# Patient Record
Sex: Female | Born: 1985 | Race: White | Hispanic: No | Smoking: Never smoker
Health system: Southern US, Community
[De-identification: ages and names within clinical notes are randomized; demographics above are authoritative.]

## PROBLEM LIST (undated history)

## (undated) ENCOUNTER — Inpatient Hospital Stay (HOSPITAL_COMMUNITY): Payer: Self-pay

## (undated) DIAGNOSIS — O034 Incomplete spontaneous abortion without complication: Secondary | ICD-10-CM

## (undated) DIAGNOSIS — Z9851 Tubal ligation status: Secondary | ICD-10-CM

## (undated) DIAGNOSIS — O09299 Supervision of pregnancy with other poor reproductive or obstetric history, unspecified trimester: Secondary | ICD-10-CM

## (undated) DIAGNOSIS — N390 Urinary tract infection, site not specified: Secondary | ICD-10-CM

## (undated) DIAGNOSIS — F329 Major depressive disorder, single episode, unspecified: Secondary | ICD-10-CM

## (undated) HISTORY — DX: Supervision of pregnancy with other poor reproductive or obstetric history, unspecified trimester: O09.299

## (undated) HISTORY — DX: Urinary tract infection, site not specified: N39.0

## (undated) HISTORY — PX: NO PAST SURGERIES: SHX2092

---

## 2009-09-07 DIAGNOSIS — F32A Depression, unspecified: Secondary | ICD-10-CM

## 2009-09-07 HISTORY — DX: Depression, unspecified: F32.A

## 2010-07-03 ENCOUNTER — Inpatient Hospital Stay (HOSPITAL_COMMUNITY): Admission: AD | Admit: 2010-07-03 | Discharge: 2010-07-03 | Payer: Self-pay | Admitting: Obstetrics and Gynecology

## 2010-07-28 ENCOUNTER — Inpatient Hospital Stay (HOSPITAL_COMMUNITY)
Admission: AD | Admit: 2010-07-28 | Discharge: 2010-07-28 | Payer: Self-pay | Source: Home / Self Care | Admitting: Obstetrics and Gynecology

## 2010-08-14 ENCOUNTER — Inpatient Hospital Stay (HOSPITAL_COMMUNITY)
Admission: AD | Admit: 2010-08-14 | Discharge: 2010-08-14 | Payer: Self-pay | Source: Home / Self Care | Admitting: Obstetrics and Gynecology

## 2010-09-26 ENCOUNTER — Inpatient Hospital Stay (HOSPITAL_COMMUNITY)
Admission: AD | Admit: 2010-09-26 | Discharge: 2010-09-28 | Payer: Self-pay | Source: Home / Self Care | Attending: Obstetrics and Gynecology | Admitting: Obstetrics and Gynecology

## 2010-09-26 ENCOUNTER — Encounter (INDEPENDENT_AMBULATORY_CARE_PROVIDER_SITE_OTHER): Payer: Self-pay | Admitting: Obstetrics and Gynecology

## 2010-09-29 LAB — RPR: RPR Ser Ql: NONREACTIVE

## 2010-09-29 LAB — CBC
Hemoglobin: 13.3 g/dL (ref 12.0–15.0)
MCV: 88.7 fL (ref 78.0–100.0)
Platelets: 187 10*3/uL (ref 150–400)
RBC: 4.32 MIL/uL (ref 3.87–5.11)
WBC: 11.2 10*3/uL — ABNORMAL HIGH (ref 4.0–10.5)

## 2010-09-29 LAB — D-DIMER, QUANTITATIVE: D-Dimer, Quant: 0.95 ug/mL-FEU — ABNORMAL HIGH (ref 0.00–0.48)

## 2010-09-30 LAB — CBC
HCT: 31.7 % — ABNORMAL LOW (ref 36.0–46.0)
Platelets: 166 10*3/uL (ref 150–400)
RBC: 3.6 MIL/uL — ABNORMAL LOW (ref 3.87–5.11)
RDW: 12.8 % (ref 11.5–15.5)
WBC: 10.2 10*3/uL (ref 4.0–10.5)

## 2010-10-01 LAB — WOUND CULTURE: Culture: NO GROWTH

## 2010-10-01 LAB — TORCH-IGM(TOXO/ RUB/ CMV/ HSV) W TITER
CMV IgM: 0.27 Index (ref <0.90)
HSV IgM Ab SCREEN: NOT DETECTED
RPR Screen: NONREACTIVE
Rubella IgM Index: 2.75 Ratio — ABNORMAL HIGH (ref <0.90)
Toxoplasma IgM: NEGATIVE

## 2010-10-03 LAB — ANAEROBIC CULTURE

## 2010-10-08 DEATH — deceased

## 2010-10-10 NOTE — Discharge Summary (Signed)
NAME:  [name], [name]              ACCOUNT NO.:  [id number]  MEDICAL RECORD NO.:  [id number]          PATIENT TYPE:  INP  LOCATION:  9302                          [facility]ACILITY:  [facility]  PHYSICIAN:  Hal Morales, M.D.DATE OF BIRTH:  02-05-1986  DATE OF ADMISSION:  09/26/2010 DATE OF DISCHARGE:  09/28/2010                              DISCHARGE SUMMARY   ADMISSION DIAGNOSES: 1. Intrauterine fetal demise at 40 weeks and 1 day. 2. Unfavorable cervix, plan induction with Cytotec.  DISCHARGE DIAGNOSES: 1. Intrauterine fetal demise at 40 weeks and 1 day. 2. Unfavorable cervix, plan induction with Cytotec. 3. Apparent cord accident.  HOSPITAL COURSE:  The patient was admitted to labor and delivery at 11:45 a.m. after being diagnosed with an intrauterine fetal demise in a routine office visit.  The patient had not complained of decreased fetal movement, leaking, bleeding, fever or chills at that visit.  On admission, her cervix was 1-2 cm, 50% -2 vertex, posterior. Contractions were irregular and mild. Plan was made to start Cytotec for induction.  The patient was seen by the chaplain that evening.  The patient received 50 mcg, 100 mcg, and 200 mcg doses of Cytotec throughout the evening and night of January 20.  She was started on Dilaudid PCA for pain management.  She experienced itching and was given Benadryl and the Dilaudid PCA was stopped.  Her membranes were artificially ruptured at 2:30 a.m. on January 21.  She had slightly pink tinged fluid with no odor.  Bedside ultrasound was performed secondary to the patient feeling that the baby had moved under her chest more.  Baby was vertex but slightly oblique with head to mom's left.  Cervix was 2-3 but still long, so the 200 mcg dose of Cytotec was placed at that time.  The patient was switched to morphine PCA.  She declined epidural.    Low dose Pitocin was started at approximately 10:30 a.m. on the 21st.  By 5:30 in the evening,  she had progressed 5-6 cm, 80%, -2, and wished to proceed with an epidural.  The patient had a normal spontaneous vaginal delivery of a 9 pound 10 ounce stillborn female infant at 8:03 p.m. APGARS 0 and 0.  Cord was noted to be tightly wrapped around his shoulders and chest and wrapped around his upper left arm.  There was minimal skin sloughing or maceration and minimal overriding of cranial sutures.  There was adherent blood noted on 1/8th to 1/4th of the placental margin representing a questionable abruption.  The uterus was slightly boggy after delivery of placenta.  A 1000 mcg of Cytotec was placed rectally with good response.  The patient's family was present and supportive. Placenta was sent to pathology.  Comfort care and process was initiated. The patient declined autopsy.  The patient had a routine postpartum course.  Vital signs remained stable.  She was afebrile throughout her stay.  Lochia was appropriate.  Social work consultation was performed on September 28, 2010.  The patient stated she had an excellent support system including her husband and extended family.  The patient's mother remained at the bedside.  Her  husband was in and out taking care of their 17 and 82-year-old daughters.  The patient was tearful but appropriate throughout her stay.  She was given information about [facility], hospice and palliative care at [facility] and [facility].  In addition to the hospital photographs, she also had pictures taken by a photographer with [employer].  She desired discharge on postpartum day #1.  Labs were as follows.  Admission CBC showed white blood cell count 11.2, hemoglobin 13.3, hematocrit 38.3, and platelet count of 187,000.  D- dimer elevated at 0.95 but appropriate for gestation.  Fibrinogen normal at 465.  RPR nonreactive.  Postpartum CBC showed a white blood cell count of 10.2, hemoglobin 10.9, hematocrit 31.7, platelet count of 166,000.  Preliminary  anaerobic cultures showed no growth.  TORCH titers negative for all except positive Rubella IgM, although in routine prenatal labs she was shown to be rubella equivocal.  Preliminary aerobic wound culture of the placenta shows no growth.  Placenta pathology report shows a normal 620 g placenta with 3-vessel cord.  No evidence of chorioamnionitis.  There was narrowing of the cord approximately 9 cm from the fetal where the cord measured approximately 1 cm in diameter and there is hemorrhagic discoloration in this area which may represent possible stricture.    DISCHARGE CONDITION: The patient was discharged home in stable condition on postpartum day #1.  DISCHARGE INSTRUCTIONS: Per the Taking Care of Yourself booklet which is appropriate for mother's experiencing IUFD. Postpartum depression  precautions were given and the patient was strongly encouraged to call if symptoms developed and was also offered referral for couseling as needed.  DISCHARGE MEDICATIONS:  Ibuprofen 600 mg p.o. q.6 p.r.n. for pain, Ambien 10 mg p.o. at bedtime p.r.n. for insomnia.    DISCHARGE FOLLOW-UP: [facility] in 6 weeks or p.r.n.     [name], CNM   ______________________________ Hal Morales, M.D.    VS/MEDQ  D:  10/02/2010  T:  10/02/2010  Job:  [id number]  Electronically Signed by [name]  on 10/05/2010 04:54:09 PM Electronically Signed by Dierdre Forth M.D. on 10/10/2010 11:38:28 AM

## 2010-11-18 LAB — URINE MICROSCOPIC-ADD ON

## 2010-11-18 LAB — DIFFERENTIAL
Basophils Absolute: 0 10*3/uL (ref 0.0–0.1)
Lymphocytes Relative: 12 % (ref 12–46)
Monocytes Absolute: 0.6 10*3/uL (ref 0.1–1.0)
Neutro Abs: 8.4 10*3/uL — ABNORMAL HIGH (ref 1.7–7.7)

## 2010-11-18 LAB — COMPREHENSIVE METABOLIC PANEL
Albumin: 3 g/dL — ABNORMAL LOW (ref 3.5–5.2)
BUN: 5 mg/dL — ABNORMAL LOW (ref 6–23)
Chloride: 107 mEq/L (ref 96–112)
Creatinine, Ser: 0.42 mg/dL (ref 0.4–1.2)
Total Bilirubin: 0.9 mg/dL (ref 0.3–1.2)

## 2010-11-18 LAB — CBC
MCH: 32.7 pg (ref 26.0–34.0)
MCHC: 35.5 g/dL (ref 30.0–36.0)
MCV: 92.1 fL (ref 78.0–100.0)
Platelets: 186 10*3/uL (ref 150–400)
RBC: 4.18 MIL/uL (ref 3.87–5.11)
RDW: 13.1 % (ref 11.5–15.5)

## 2010-11-18 LAB — URINALYSIS, ROUTINE W REFLEX MICROSCOPIC
Leukocytes, UA: NEGATIVE
Nitrite: NEGATIVE
Specific Gravity, Urine: 1.025 (ref 1.005–1.030)
pH: 7 (ref 5.0–8.0)

## 2010-11-18 LAB — URINE CULTURE

## 2010-11-19 LAB — URINALYSIS, DIPSTICK ONLY
Bilirubin Urine: NEGATIVE
Glucose, UA: NEGATIVE mg/dL
Hgb urine dipstick: NEGATIVE
Ketones, ur: 15 mg/dL — AB
Leukocytes, UA: NEGATIVE
Nitrite: NEGATIVE
Protein, ur: NEGATIVE mg/dL
Specific Gravity, Urine: <1.005 — ABNORMAL LOW (ref 1.005–1.030)
Urobilinogen, UA: 0.2 mg/dL (ref 0.0–1.0)
pH: 7 (ref 5.0–8.0)

## 2011-03-23 ENCOUNTER — Encounter (HOSPITAL_COMMUNITY): Payer: Self-pay | Admitting: *Deleted

## 2011-03-23 NOTE — H&P (Addendum)
NAMETaffie, [name]              ACCOUNT NO.:  [id number]  MEDICAL RECORD NO.:  [id number]  LOCATION:  PERIO                         FACILITY:  [facility]  PHYSICIAN:  Janine Limbo, M.D.DATE OF BIRTH:  [birth year]  DATE OF ADMISSION:  03/24/2011 DATE OF DISCHARGE:                             HISTORY & PHYSICAL   HISTORY OF PRESENT ILLNESS:  Ms. [name] is a 25 year old female, gravida 4, para 3-0-0-2, who presents with a first trimester miscarriage.  She is to have a dilatation and evacuation performed.  The patient has been followed at the [facility].  An ultrasound confirmed no fetal heart tones.  The gestation measures 10 weeks' in size by ultrasound even though the patient should be closer to 13 weeks. No fetal heart motions were noted.  OBSTETRICAL HISTORY:  The patient has had 2 term vaginal deliveries and 1 term vaginal delivery of a nonviable infant.  DRUG ALLERGIES:  No known drug allergies.  PAST MEDICAL HISTORY:  The patient has a history of pyelonephritis.  She also has had her wisdom teeth removed.  SOCIAL HISTORY:  The patient denies cigarette use and recreational drug use other than occasional alcohol.  REVIEW OF SYSTEMS:  Noncontributory.  FAMILY HISTORY:  Noncontributory.  PHYSICAL EXAMINATION:  VITAL SIGNS:  Weight is 155 pounds.  Height is 5 feet 8 inches. HEENT:  Within normal limits. CHEST:  Clear. HEART:  Regular rate and rhythm. BREASTS:  Without masses. ABDOMEN:  Nontender. EXTREMITIES:  Grossly normal. NEUROLOGIC:  Grossly normal. PELVIC:  The cervix was closed and long when last checked.  LABORATORY VALUES:  The patient's blood type is O+, antibody screen negative, VDRL nonreactive, rubella immune, HBsAg negative, HIV nonreactive.  ASSESSMENT:  First trimester miscarriage.  PLAN:  The patient will undergo dilatation and evacuation.  She understands the indications  for her surgical procedure and she accepts the risks of, but not limited to, anesthetic complications, bleeding, infection, and possible damage to surrounding organs.     Janine Limbo, M.D.     AVS/MEDQ  D:  03/23/2011  T:  03/23/2011  Job:  [id number]

## 2011-03-24 ENCOUNTER — Ambulatory Visit (HOSPITAL_COMMUNITY)
Admission: RE | Admit: 2011-03-24 | Discharge: 2011-03-24 | Disposition: A | Discharge status: Home / Self Care | Payer: Managed Care, Other (non HMO) | Source: Ambulatory Visit | Attending: Obstetrics and Gynecology | Admitting: Obstetrics and Gynecology

## 2011-03-24 ENCOUNTER — Ambulatory Visit (HOSPITAL_COMMUNITY): Payer: Managed Care, Other (non HMO) | Admitting: Anesthesiology

## 2011-03-24 ENCOUNTER — Encounter (HOSPITAL_COMMUNITY): Payer: Self-pay | Admitting: Obstetrics and Gynecology

## 2011-03-24 ENCOUNTER — Encounter (HOSPITAL_COMMUNITY): Payer: Self-pay | Admitting: Anesthesiology

## 2011-03-24 ENCOUNTER — Other Ambulatory Visit: Payer: Self-pay | Admitting: Obstetrics and Gynecology

## 2011-03-24 ENCOUNTER — Encounter (HOSPITAL_COMMUNITY)
Admission: RE | Disposition: A | Discharge status: Home / Self Care | Payer: Self-pay | Source: Ambulatory Visit | Attending: Obstetrics and Gynecology

## 2011-03-24 DIAGNOSIS — O034 Incomplete spontaneous abortion without complication: Secondary | ICD-10-CM

## 2011-03-24 HISTORY — DX: Incomplete spontaneous abortion without complication: O03.4

## 2011-03-24 HISTORY — DX: Major depressive disorder, single episode, unspecified: F32.9

## 2011-03-24 HISTORY — PX: DILATION AND EVACUATION: SHX1459

## 2011-03-24 LAB — CBC
HCT: 38.6 % (ref 36.0–46.0)
MCV: 86.5 fL (ref 78.0–100.0)
Platelets: 234 10*3/uL (ref 150–400)
RBC: 4.46 MIL/uL (ref 3.87–5.11)
WBC: 5.9 10*3/uL (ref 4.0–10.5)

## 2011-03-24 SURGERY — DILATION AND EVACUATION, UTERUS
Anesthesia: Monitor Anesthesia Care

## 2011-03-24 MED ORDER — LIDOCAINE HCL (CARDIAC) 20 MG/ML IV SOLN
INTRAVENOUS | Status: AC
  Filled 2011-03-24: qty 5

## 2011-03-24 MED ORDER — IBUPROFEN 800 MG PO TABS: 800.0000 mg | ORAL_TABLET | Freq: Three times a day (TID) | ORAL | Status: DC | PRN

## 2011-03-24 MED ORDER — PROPOFOL 10 MG/ML IV EMUL
INTRAVENOUS | Status: AC
  Filled 2011-03-24: qty 20

## 2011-03-24 MED ORDER — FENTANYL CITRATE 0.05 MG/ML IJ SOLN: 25.0000 ug | INTRAMUSCULAR | Status: DC | PRN

## 2011-03-24 MED ORDER — HYDROCODONE-ACETAMINOPHEN 10-500 MG PO TABS: 1.0000 | ORAL_TABLET | Freq: Four times a day (QID) | ORAL | Status: AC | PRN

## 2011-03-24 MED ORDER — FENTANYL CITRATE 0.05 MG/ML IJ SOLN
INTRAMUSCULAR | Status: DC | PRN
  Administered 2011-03-24: 50 ug via INTRAVENOUS
  Administered 2011-03-24 (×2): 25 ug via INTRAVENOUS

## 2011-03-24 MED ORDER — BUPIVACAINE-EPINEPHRINE 0.5% -1:200000 IJ SOLN
INTRAMUSCULAR | Status: DC | PRN
  Administered 2011-03-24: 10 mL

## 2011-03-24 MED ORDER — ONDANSETRON HCL 4 MG/2ML IJ SOLN
INTRAMUSCULAR | Status: DC | PRN
  Administered 2011-03-24: 4 mg via INTRAVENOUS

## 2011-03-24 MED ORDER — DIPHENHYDRAMINE HCL 25 MG PO CAPS: 50.0000 mg | ORAL_CAPSULE | ORAL | Status: DC | PRN

## 2011-03-24 MED ORDER — MIDAZOLAM HCL 5 MG/5ML IJ SOLN
INTRAMUSCULAR | Status: DC | PRN
  Administered 2011-03-24: 2 mg via INTRAVENOUS

## 2011-03-24 MED ORDER — ONDANSETRON HCL 4 MG/2ML IJ SOLN
INTRAMUSCULAR | Status: AC
  Filled 2011-03-24: qty 2

## 2011-03-24 MED ORDER — MEPERIDINE HCL 25 MG/ML IJ SOLN: 6.2500 mg | INTRAMUSCULAR | Status: DC | PRN

## 2011-03-24 MED ORDER — HYDROCODONE-ACETAMINOPHEN 5-325 MG PO TABS
1.0000 | ORAL_TABLET | Freq: Once | ORAL | Status: AC
  Administered 2011-03-24: 1 via ORAL

## 2011-03-24 MED ORDER — PROMETHAZINE HCL 12.5 MG PO TABS: 12.5000 mg | ORAL_TABLET | Freq: Four times a day (QID) | ORAL | Status: DC | PRN

## 2011-03-24 MED ORDER — LACTATED RINGERS IV SOLN
INTRAVENOUS | Status: DC
  Administered 2011-03-24 (×3): via INTRAVENOUS

## 2011-03-24 MED ORDER — LIDOCAINE HCL (CARDIAC) 20 MG/ML IV SOLN
INTRAVENOUS | Status: DC | PRN
  Administered 2011-03-24: 50 mg via INTRAVENOUS

## 2011-03-24 MED ORDER — HYDROCODONE-ACETAMINOPHEN 5-325 MG PO TABS
ORAL_TABLET | ORAL | Status: AC
  Administered 2011-03-24: 1 via ORAL
  Filled 2011-03-24: qty 1

## 2011-03-24 MED ORDER — KETOROLAC TROMETHAMINE 30 MG/ML IJ SOLN
INTRAMUSCULAR | Status: DC | PRN
  Administered 2011-03-24: 30 mg via INTRAVENOUS

## 2011-03-24 MED ORDER — FENTANYL CITRATE 0.05 MG/ML IJ SOLN
INTRAMUSCULAR | Status: AC
  Administered 2011-03-24: 50 ug
  Filled 2011-03-24: qty 2

## 2011-03-24 MED ORDER — KETOROLAC TROMETHAMINE 60 MG/2ML IM SOLN
INTRAMUSCULAR | Status: DC | PRN
  Administered 2011-03-24 (×2): 30 mg via INTRAMUSCULAR

## 2011-03-24 MED ORDER — FENTANYL CITRATE 0.05 MG/ML IJ SOLN
INTRAMUSCULAR | Status: AC
  Filled 2011-03-24: qty 2

## 2011-03-24 MED ORDER — MIDAZOLAM HCL 2 MG/2ML IJ SOLN
INTRAMUSCULAR | Status: AC
  Filled 2011-03-24: qty 2

## 2011-03-24 MED ORDER — PROPOFOL 10 MG/ML IV EMUL
INTRAVENOUS | Status: DC | PRN
  Administered 2011-03-24 (×2): 20 mg via INTRAVENOUS
  Administered 2011-03-24: 60 mg via INTRAVENOUS
  Administered 2011-03-24: 50 mg via INTRAVENOUS

## 2011-03-24 SURGICAL SUPPLY — 21 items
CATH ROBINSON RED A/P 16FR (CATHETERS) ×2 IMPLANT
CLOTH BEACON ORANGE TIMEOUT ST (SAFETY) ×2 IMPLANT
DECANTER SPIKE VIAL GLASS SM (MISCELLANEOUS) ×2 IMPLANT
DRAPE UTILITY XL STRL (DRAPES) ×2 IMPLANT
GLOVE BIOGEL PI IND STRL 8.5 (GLOVE) ×1 IMPLANT
GLOVE BIOGEL PI INDICATOR 8.5 (GLOVE) ×1
GLOVE ECLIPSE 8.0 STRL XLNG CF (GLOVE) ×4 IMPLANT
GOWN BRE IMP SLV AUR LG STRL (GOWN DISPOSABLE) ×2 IMPLANT
GOWN STRL REIN 2XL LVL4 (GOWN DISPOSABLE) ×2 IMPLANT
KIT BERKELEY 1ST TRIMESTER 3/8 (MISCELLANEOUS) ×2 IMPLANT
NEEDLE SPNL 22GX3.5 QUINCKE BK (NEEDLE) ×2 IMPLANT
NS IRRIG 1000ML POUR BTL (IV SOLUTION) ×2 IMPLANT
PACK VAGINAL MINOR WOMEN LF (CUSTOM PROCEDURE TRAY) ×2 IMPLANT
PAD PREP 24X48 CUFFED NSTRL (MISCELLANEOUS) ×2 IMPLANT
SET BERKELEY SUCTION TUBING (SUCTIONS) ×2 IMPLANT
SYR CONTROL 10ML LL (SYRINGE) ×2 IMPLANT
TOWEL OR 17X24 6PK STRL BLUE (TOWEL DISPOSABLE) ×4 IMPLANT
VACURETTE 10 RIGID CVD (CANNULA) ×2 IMPLANT
VACURETTE 7MM CVD STRL WRAP (CANNULA) IMPLANT
VACURETTE 8 RIGID CVD (CANNULA) ×2 IMPLANT
VACURETTE 9 RIGID CVD (CANNULA) IMPLANT

## 2011-03-24 NOTE — Brief Op Note (Signed)
03/24/2011  12:51 PM  PATIENT:  [name]  25 y.o. female  PRE-OPERATIVE DIAGNOSIS:  missed ab  POST-OPERATIVE DIAGNOSIS:  * No post-op diagnosis entered *  PROCEDURE:  Procedure(s): DILATATION AND EVACUATION (D&E)  SURGEON:  Surgeon(s): Janine Limbo  PHYSICIAN ASSISTANT:   ASSISTANTS: none   ANESTHESIA:   IV sedation  ESTIMATED BLOOD LOSS: * No blood loss amount entered *   BLOOD ADMINISTERED:none  DRAINS: none   LOCAL MEDICATIONS USED:  MARCAINE 10CC  SPECIMEN:  Source of Specimen:  POC  DISPOSITION OF SPECIMEN:  PATHOLOGY  COUNTS:  NO None  TOURNIQUET:  * No tourniquets in log *  DICTATION #: [id number]  PLAN OF CARE: Discharge  PATIENT DISPOSITION:  PACU - hemodynamically stable.   Delay start of Pharmacological VTE agent (>24hrs) due to surgical blood loss or risk of bleeding:  yes       Op Note dictated: [id number]

## 2011-03-24 NOTE — Transfer of Care (Signed)
Immediate Anesthesia Transfer of Care Note  Patient: [name]  Procedure(s) Performed:  DILATATION AND EVACUATION (D&E)  Patient Location: PACU  Anesthesia Type: MAC  Level of Consciousness: awake, alert  and oriented  Airway & Oxygen Therapy: Patient Spontanous Breathing  Post-op Assessment: Report given to PACU RN and Post -op Vital signs reviewed and stable  Post vital signs: Reviewed and stable  Complications: No apparent anesthesia complications

## 2011-03-24 NOTE — Anesthesia Postprocedure Evaluation (Signed)
  Anesthesia Post-op Note  Patient: [name]  Procedure(s) Performed:  DILATATION AND EVACUATION (D&E)  Patient Location: PACU  Anesthesia Type: MAC  Level of Consciousness: awake, alert  and oriented  Airway and Oxygen Therapy: Patient Spontanous Breathing  Post-op Pain: none  Post-op Assessment: Post-op Vital signs reviewed, Patient's Cardiovascular Status Stable, Respiratory Function Stable, Patent Airway, No signs of Nausea or vomiting and Pain level controlled  Post-op Vital Signs: Reviewed and stable  Complications: No apparent anesthesia complications

## 2011-03-24 NOTE — Anesthesia Preprocedure Evaluation (Signed)
Anesthesia Evaluation  Name, MR# and DOB Patient awake  General Assessment Comment  Reviewed: Allergy & Precautions, H&P  and Patient's Chart, lab work & pertinent test results  Airway Mallampati: III TM Distance: >3 FB Neck ROM: Full    Dental No notable dental hx (+) Teeth Intact   Pulmonaryneg pulmonary ROS  Asthma: Pregnancy Induced. Inhalers ineffective.    pulmonary exam normal   Cardiovascular Regular Normal   Neuro/Psych  GI/Hepatic/Renal negative GI ROS, negative Liver ROS, and negative Renal ROS (+)       Endo/Other  Negative Endocrine ROS (+)   Abdominal Normal abdominal exam  (+)   Musculoskeletal negative musculoskeletal ROS (+)  Hematology   Peds  Reproductive/Obstetrics negative OB ROS (+) Pregnancy 13 wks   Anesthesia Other Findings             Anesthesia Physical Anesthesia Plan  ASA: II  Anesthesia Plan: MAC and General   Post-op Pain Management:    Induction: Intravenous  Airway Management Planned: Mask and Oral ETT  Additional Equipment:   Intra-op Plan:   Post-operative Plan: Extubation in OR  Informed Consent: I have reviewed the patients History and Physical, chart, labs and discussed the procedure including the risks, benefits and alternatives for the proposed anesthesia with the patient or authorized representative who has indicated his/her understanding and acceptance.   Dental advisory given  Plan Discussed with: Anesthesiologist (AP)  Anesthesia Plan Comments:         Anesthesia Quick Evaluation

## 2011-03-25 NOTE — Op Note (Signed)
NAMETaneah, Masri Jonny              ACCOUNT NO.:  [id number]  MEDICAL RECORD NO.:  [id number]  LOCATION:  [facility]                          [facility]  PHYSICIAN:  Janine Limbo, M.D.DATE OF BIRTH:  1985/11/08  DATE OF PROCEDURE:  03/24/2011 DATE OF DISCHARGE:  03/24/2011                              OPERATIVE REPORT   PREOPERATIVE DIAGNOSIS:  First trimester incomplete abortion.  POSTOPERATIVE DIAGNOSIS:  First trimester incomplete abortion.  PROCEDURE:  Suction dilatation and evacuation.  SURGEON:  Janine Limbo, MD  FIRST ASSISTANT:  None.  ANESTHETIC:  Monitored anesthetic control.  DISPOSITION:  Ms. Pilling is a 25 year old female, gravida 4, para 3-0- 0-2, who presents with a first trimester incomplete abortion.  She understands the indications for her surgical procedure as well as the alternative treatment options.  She accepts the risks of, but not limited to, anesthetic complications, bleeding, infection, and possible damage to the surrounding organs.  FINDINGS:  The patient is blood type is O+.  A large-to-moderate amount of products of conception were removed from a 12-week size uterus.  No adnexal masses were appreciated.  PROCEDURE:  The patient was taken to the operating room where she was given medication through her IV line.  The perineum and vagina were prepped with multiple layers of Betadine.  The bladder was emptied.  The patient was sterilely draped.  An examination under anesthesia was performed.  A paracervical block was placed using 10 mL of 0.25% Marcaine with epinephrine.  The uterus sounded to 12 cm.  The cervix was gently dilated.  The uterine cavity was then evacuated using a size 10 suction curette followed by medium sharp curette.  The cavity was felt to be clean at the end of our procedure.  Hemostasis was adequate.  All instruments were removed.  The patient's exam was repeated and the uterus was noted to be small and firm.   Sponge and needle counts were correct.  The estimated blood loss was approximately 100 mL.  The patient tolerated her procedure well.  She was awakened from her anesthetic without difficulty and transported to the recovery room in stable condition.  The products of conception was sent to pathology.  FOLLOWUP INSTRUCTIONS:  The patient will return to see Dr. Stefano Gaul in 2 weeks for followup examination.  She was given a copy of the postoperative instructions for the patients who have undergone a dilatation and evacuation.  She was given a prescription for 1. Motrin 800 mg one tablet every 8 hours as needed for mild-to-     moderate pain. 2. Vicodin one or two tablets every 4 hours as needed for severe pain. 3. Phenergan 12.5 mg one tablet every 4-6 hours as needed for nausea.     Janine Limbo, M.D.     AVS/MEDQ  D:  03/24/2011  T:  April 01, 2011  Job:  [id number]

## 2011-03-26 ENCOUNTER — Ambulatory Visit (HOSPITAL_COMMUNITY): Payer: Managed Care, Other (non HMO)

## 2011-03-26 ENCOUNTER — Ambulatory Visit (HOSPITAL_COMMUNITY)
Admission: RE | Admit: 2011-03-26 | Discharge: 2011-03-26 | Disposition: A | Discharge status: Home / Self Care | Payer: Managed Care, Other (non HMO) | Source: Ambulatory Visit | Attending: Obstetrics and Gynecology | Admitting: Obstetrics and Gynecology

## 2011-03-26 ENCOUNTER — Other Ambulatory Visit: Payer: Self-pay | Admitting: Maternal and Fetal Medicine

## 2011-03-26 DIAGNOSIS — N96 Recurrent pregnancy loss: Secondary | ICD-10-CM | POA: Insufficient documentation

## 2011-03-26 DIAGNOSIS — IMO0002 Reserved for concepts with insufficient information to code with codable children: Secondary | ICD-10-CM | POA: Insufficient documentation

## 2011-03-26 NOTE — Progress Notes (Signed)
Genetic Counseling  High-Risk Gestation Note  Appointment Date:  03/26/2011 Referred By: [name] Nails, MD Date of Birth:  1986/05/14  I met with Ms. [name] for preconception genetic counseling given a history of two pregnancy losses.   Both family histories were reviewed and found to be contributory  for multiple miscarriages.  The patient reported that she and her partner have two healthy girls. Their third pregnancy, a son, resulted in intrauterine fetal demise at [redacted] weeks gestation. The cord was reportedly narrowed and wrapped around the baby's neck. However, the patient reported that the official cause was reported as undetermined. The patient recently had a first trimester miscarriage. She reported that her mother has a history of 5 miscarriages and one daughter who was stillborn at 21 weeks. Her The patient's parents also had 12 children in addition to the pregnancy losses. An underlying cause for her mother's pregnancy losses is not known.   Approximately 1 in 6 confirmed pregnancies results in miscarriage. A single underlying cause is more likely to be suspected when a couple has experienced 3 or more losses. It is less likely that there will be an identifiable single underlying cause when a couple has experienced less than 3 losses. We discussed several possible causes including chromosome rearrangements, antibodies, and thrombophilia. We reviewed chromosomes and examples of chromosome conditions. In approximately 3-8% of couples with recurrent pregnancy loss, one partner carries a chromosome variant, such as a balanced translocation. Being a carrier of a chromosome variant can increase the risk for abnormalities in the sperm or egg cell, which can increase the risk for miscarriage or the birth of a child with birth defects and/or mental retardation. We reviewed that inherited predisposition to clotting can also increase the risk for miscarriage given the association with increased  risk for disrupted blood flow in the pregnancy. Additionally, the presence of certain antibodies have been associated with an increased risk for miscarriage. Regarding testing for these factors given the patient's history, Dr. Sherrie Collins recommended assessing for lupus anticoagulant, anticardiolipin, and beta-2 glycoprotein. We also discussed the option of blood chromosome analysis for the patient and her partner. After careful consideration, the patient elected to proceed with these studies, which were performed at the time of today's visit. We discussed that blood chromosome analysis is also available to the patient's partner. The couple may consider this pending the results of [name]' testing.  Additionally, the patient reported that her sister, the youngest of the siblings, has achondroplasia. We discussed that achondroplasia is a common genetic form of disproportionate short stature.  Approximately 80% of cases of achondroplasia occur as a result of a de novo mutation. Given that no additional relatives were reported with dwarfism, we discussed that her sister's achondroplasia was likely the result of a de novo mutation, which would not increase recurrence risk for [name]' offspring. Additional information regarding her sister's condition may alter recurrence risk assessment. Without further information regarding the provided family history, an accurate genetic risk cannot be calculated.   Further genetic counseling is warranted if more information is obtained.  She denied exposure to environmental toxins or chemical agents.  She denied the use of alcohol, tobacco or street drugs.  Her medical and surgical history were contributory for D&C following her recent miscarriage.   We counseled the patient for approximately 30 minutes regarding the above risks.     Lori Braun Trusten Hume, MS, [facility] 03/26/2011

## 2011-04-08 ENCOUNTER — Other Ambulatory Visit: Payer: Self-pay | Admitting: Maternal and Fetal Medicine

## 2011-04-08 DEATH — deceased

## 2011-04-09 ENCOUNTER — Encounter (HOSPITAL_COMMUNITY): Payer: Self-pay | Admitting: Obstetrics and Gynecology

## 2011-04-10 ENCOUNTER — Telehealth (HOSPITAL_COMMUNITY): Payer: Self-pay | Admitting: MS"

## 2011-04-10 NOTE — Telephone Encounter (Signed)
  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 Other studies from her visit are pending. Reviewed that peripheral chromosome analysis also available for her husband. She will talk with him and contact our office should he desire to pursue this option.

## 2011-05-14 ENCOUNTER — Telehealth (HOSPITAL_COMMUNITY): Payer: Self-pay | Admitting: MS"

## 2011-05-14 NOTE — Telephone Encounter (Signed)
Patient called inquiring about lab results. Discussed that lupus anticoagulant panel, beta-2-glycoprotein antibodies, and cardiolipin antibodies were normal. Reviewed that previously discussed normal blood chromosome analysis for patient (46,XX). Reviewed that chromosome analysis is also available for patient's partner. However, we are less likely to identify an etiology for pregnancy loss when a couple has experienced less than 2 losses. The patient stated that she would discuss with her partner and call us to schedule appointment for blood draw, should he desire to proceed with chromosome analysis.

## 2011-09-08 NOTE — L&D Delivery Note (Signed)
Delivery Note At 6:39 PM a viable and healthy female was delivered via Vaginal, Spontaneous Delivery (Presentation: OA ).  APGAR: 9, 9; weight- pending .   Placenta status: Intact, Spontaneous.  Cord: 3 vessels with the following complications: None.  Cord pH:not sent. Cord blood sent and also collected for banking/donation.  Anesthesia: Epidural  Episiotomy: None Lacerations: None Est. Blood Loss (mL): 400 cc  Mom to postpartum.  Baby to with mother.  Edmundo Tedesco R 06/16/2012, 7:33 PM

## 2011-09-21 ENCOUNTER — Telehealth: Payer: Self-pay | Admitting: *Deleted

## 2011-09-21 NOTE — Telephone Encounter (Signed)
patient confirmed over the phone the new date and time on 10-26-2011 starting at 1:00pm

## 2011-09-22 ENCOUNTER — Telehealth: Payer: Self-pay | Admitting: Oncology

## 2011-09-22 NOTE — Telephone Encounter (Signed)
Referred by Dr. Shea [facility] AT [facility]

## 2011-10-26 ENCOUNTER — Encounter: Payer: Self-pay | Admitting: Oncology

## 2011-10-26 ENCOUNTER — Ambulatory Visit: Payer: Managed Care, Other (non HMO)

## 2011-10-26 ENCOUNTER — Telehealth: Payer: Self-pay | Admitting: Oncology

## 2011-10-26 ENCOUNTER — Ambulatory Visit (HOSPITAL_BASED_OUTPATIENT_CLINIC_OR_DEPARTMENT_OTHER): Payer: Managed Care, Other (non HMO) | Admitting: Oncology

## 2011-10-26 ENCOUNTER — Other Ambulatory Visit (HOSPITAL_BASED_OUTPATIENT_CLINIC_OR_DEPARTMENT_OTHER): Payer: Managed Care, Other (non HMO) | Admitting: Lab

## 2011-10-26 ENCOUNTER — Other Ambulatory Visit: Payer: Self-pay | Admitting: *Deleted

## 2011-10-26 VITALS — BP 103/67 | HR 84 | Temp 98.6°F | Ht 69.0 in | Wt 153.2 lb

## 2011-10-26 DIAGNOSIS — N96 Recurrent pregnancy loss: Secondary | ICD-10-CM

## 2011-10-26 DIAGNOSIS — D6859 Other primary thrombophilia: Secondary | ICD-10-CM

## 2011-10-26 DIAGNOSIS — D689 Coagulation defect, unspecified: Secondary | ICD-10-CM

## 2011-10-26 LAB — CBC WITH DIFFERENTIAL/PLATELET
BASO%: 0.3 % (ref 0.0–2.0)
EOS%: 1 % (ref 0.0–7.0)
HCT: 41 % (ref 34.8–46.6)
LYMPH%: 22.2 % (ref 14.0–49.7)
MCH: 29.6 pg (ref 25.1–34.0)
MCHC: 34.3 g/dL (ref 31.5–36.0)
NEUT%: 72 % (ref 38.4–76.8)
Platelets: 221 10*3/uL (ref 145–400)

## 2011-10-26 LAB — COMPREHENSIVE METABOLIC PANEL
ALT: 12 U/L (ref 0–35)
AST: 13 U/L (ref 0–37)
BUN: 8 mg/dL (ref 6–23)
CO2: 21 mEq/L (ref 19–32)
Creatinine, Ser: 0.65 mg/dL (ref 0.50–1.10)
Total Bilirubin: 0.4 mg/dL (ref 0.3–1.2)

## 2011-10-26 NOTE — Patient Instructions (Signed)
1. i will see you in 3 weeks after results of your hypercoag panel is available

## 2011-10-26 NOTE — Progress Notes (Signed)
[name]  [id number]  11/17/1985 26 y.o.  10/26/2011 1:57 PM  CC  Dr. 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 Juliene Pina   HISTORY OF PRESENT ILLNESS:  [name] is a 26 y.o. female without any medical history. She is G5P2O2. Recently found out she was pregnant at [redacted] weeks, seen by her gyncologist who ran a full thrombophilia panel and she was found to have a slightly elevated anti-thrombin III. Referred for evaluation and management.  First pregnancy at 26 years of age and carried to term (girl)  2nd pregnancy at 14 of age carried to term (girl)  3rd pregnancy carried to 40 weeks appointment babies Heart beat not found and patient was induced to labor. Examination of placenta showed slight narrowing of cord and cord was wrapped around the neck and chest. But according to medical record the cause of death undetermined.  4th pregnancy went in first trimester screen no heart beat, patient had a D and C. No cause noted on chromosomes testing were, no cause for pregnancy found.  Past Medical History   Diagnosis  Date   .  Asthma  2011     pregnancy induced   .  Depression  2011   .  Incomplete miscarriage  03/24/2011    Past Surgical History   Procedure  Date   .  No past surgeries    .  Dilation and evacuation  03/24/2011     Procedure: DILATATION AND EVACUATION (D&E); Surgeon: Janine Limbo, MD; Location: [facility]; Service: Gynecology; Laterality: N/A;    FAMILY HISTORY:  mother: asthmatic, had 1 still born at 7 weeks, 2 miscarriages, G10P11 (twin pregnancy), 3 miscarriages at 34 and older  Mat Grandmother: no pregnancy losses, no blood clotting problems  Social History  History   Substance Use Topics   .  Smoking status:  Never Smoker   .  Smokeless tobacco:  Not on file   .  Alcohol Use:  Yes      not since pregnancy   Married with 2 children , married 5 years, this is a planned  pregnancy. Stay home mom  Allergies   Allergen  Reactions   .  Bee  Swelling     Difficulty breathing    Current Outpatient Prescriptions   Medication  Sig  Dispense  Refill   .  acetaminophen (TYLENOL) 325 MG tablet  Take 650 mg by mouth every 6 (six) hours as needed.     Marland Kitchen  aspirin 81 MG tablet  Take 81 mg by mouth daily.     .  prenatal vitamin w/FE, FA (PRENATAL 1 + 1) 27-1 MG TABS  Take 1 tablet by mouth daily.     .  progesterone (PROMETRIUM) 100 MG capsule  Take 150 mg by mouth Nightly.     .  promethazine (PHENERGAN) 12.5 MG tablet  Take 1 tablet (12.5 mg total) by mouth every 6 (six) hours as needed for nausea.  30 tablet  0    REVIEW OF SYSTEMS:  A comprehensive review of systems was negative.  No personal history of blood clots, headaches, seizures, fevers chills  ECOG performance Status: 0   PHYSICAL EXAMINATION:  Blood pressure 103/67, pulse 84, temperature 98.6 F (37 C), temperature source Oral, height 5\' 9"  (1.753 m), weight 153 lb 3.2 oz (69.491 kg).  WUJ:WJXBJ, healthy, no distress and smiling  SKIN: skin color,  texture, turgor are normal  HEAD: No masses, lesions, tenderness or abnormalities  EYES: PERRLA, EOMI, sclera clear  EARS: External ears normal  OROPHARYNX:no exudate, no erythema and lips, buccal mucosa, and tongue normal  NECK: no bruits, no JVD, thyroid normal size, non-tender, without nodularity  LYMPH: no palpable lymphadenopathy, no hepatosplenomegaly  BREAST:not examined  LUNGS: clear to auscultation and percussion  HEART: regular rate & rhythm, no murmurs, no gallops, S1 normal and S2 normal  ABDOMEN:abdomen soft, non-tender, obese, normal bowel sounds and no masses or organomegaly  BACK: Back symmetric, no curvature., No CVA tenderness  EXTREMITIES:no edema, no clubbing, no cyanosis  NEURO: alert & oriented x 3 with fluent speech, no focal motor/sensory deficits, gait normal, reflexes normal and symmetric   STUDIES/RESULTS:  No results found.     ASSESSMENT   26 year old female with:  1 History of recurrent pregnancy losses. Recent thrombophilia panel showed elevated anti-thrombin III at 35 (with normal upper range at 33).  2. Patient with no personal history of blood clots.   PLAN:   1. Repeat hypercoag panel to see if the panel is consistent  2. I do not think the slight elevation is of any great concern but repeat may further help [place] stratify the risk.  3. Discussed the risk of this with patient and has demonstrated understanding  4. I will see the patient back in 3 - 4 weeks times or sooner if need arises.   All questions were answered. The patient knows to call the clinic with any problems, questions or concerns. We can certainly see the patient much sooner if necessary.  Thank you so much for allowing me to participate in the care of [name]. I will continue to follow up the patient with you and assist in her care.  I spent 40 minutes counseling the patient face to face. The total time spent in the appointment was 60 minutes.  Drue Second, MD  Medical/Oncology  [facility]  [phone] (beeper)  [phone] (Office)  10/26/2011, 1:57 PM  10/26/2011, 1:57 PM

## 2011-10-26 NOTE — Consult Note (Signed)
[name]  [id number]  03/29/1986 26 y.o.  10/26/2011 1:57 PM  CC  Dr. 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 Mody   HISTORY OF PRESENT ILLNESS:  [name] is a 26 y.o. female without any medical history. She is G5P2O2. Recently found out she was pregnant at [redacted] weeks, seen by her gyncologist who ran a full thrombophilia panel and she was found to have a slightly elevated anti-thrombin III. Referred for evaluation and management.  First pregnancy at 26 years of age and carried to term (girl)  2nd pregnancy at 22 of age carried to term (girl)  3rd pregnancy carried to 40 weeks appointment babies Heart beat not found and patient was induced to labor. Examination of placenta showed slight narrowing of cord and cord was wrapped around the neck and chest. But according to medical record the cause of death undetermined.  4th pregnancy went in first trimester screen no heart beat, patient had a D and C. No cause noted on chromosomes testing were, no cause for pregnancy found.  Past Medical History   Diagnosis  Date   .  Asthma  2011     pregnancy induced   .  Depression  2011   .  Incomplete miscarriage  03/24/2011    Past Surgical History   Procedure  Date   .  No past surgeries    .  Dilation and evacuation  03/24/2011     Procedure: DILATATION AND EVACUATION (D&E); Surgeon: Arthur V Stringer, MD; Location: [facility]; Service: Gynecology; Laterality: N/A;    FAMILY HISTORY:  mother: asthmatic, had 1 still born at 38 weeks, 2 miscarriages, G10P11 (twin pregnancy), 3 miscarriages at 45 and older  Mat Grandmother: no pregnancy losses, no blood clotting problems  Social History  History   Substance Use Topics   .  Smoking status:  Never Smoker   .  Smokeless tobacco:  Not on file   .  Alcohol Use:  Yes      not since pregnancy   Married with 2 children , married 5 years, this is a planned  pregnancy. Stay home mom  Allergies   Allergen  Reactions   .  Bee  Swelling     Difficulty breathing    Current Outpatient Prescriptions   Medication  Sig  Dispense  Refill   .  acetaminophen (TYLENOL) 325 MG tablet  Take 650 mg by mouth every 6 (six) hours as needed.     .  aspirin 81 MG tablet  Take 81 mg by mouth daily.     .  prenatal vitamin w/FE, FA (PRENATAL 1 + 1) 27-1 MG TABS  Take 1 tablet by mouth daily.     .  progesterone (PROMETRIUM) 100 MG capsule  Take 150 mg by mouth Nightly.     .  promethazine (PHENERGAN) 12.5 MG tablet  Take 1 tablet (12.5 mg total) by mouth every 6 (six) hours as needed for nausea.  30 tablet  0    REVIEW OF SYSTEMS:  A comprehensive review of systems was negative.  No personal history of blood clots, headaches, seizures, fevers chills  ECOG performance Status: 0   PHYSICAL EXAMINATION:  Blood pressure 103/67, pulse 84, temperature 98.6 F (37 C), temperature source Oral, height 5' 9" (1.753 m), weight 153 lb 3.2 oz (69.491 kg).  RAL:alert, healthy, no distress and smiling  SKIN: skin color,   texture, turgor are normal  HEAD: No masses, lesions, tenderness or abnormalities  EYES: PERRLA, EOMI, sclera clear  EARS: External ears normal  OROPHARYNX:no exudate, no erythema and lips, buccal mucosa, and tongue normal  NECK: no bruits, no JVD, thyroid normal size, non-tender, without nodularity  LYMPH: no palpable lymphadenopathy, no hepatosplenomegaly  BREAST:not examined  LUNGS: clear to auscultation and percussion  HEART: regular rate & rhythm, no murmurs, no gallops, S1 normal and S2 normal  ABDOMEN:abdomen soft, non-tender, obese, normal bowel sounds and no masses or organomegaly  BACK: Back symmetric, no curvature., No CVA tenderness  EXTREMITIES:no edema, no clubbing, no cyanosis  NEURO: alert & oriented x 3 with fluent speech, no focal motor/sensory deficits, gait normal, reflexes normal and symmetric   STUDIES/RESULTS:  No results found.     ASSESSMENT   26 year old female with:  1 History of recurrent pregnancy losses. Recent thrombophilia panel showed elevated anti-thrombin III at 35 (with normal upper range at 33).  2. Patient with no personal history of blood clots.   PLAN:   1. Repeat hypercoag panel to see if the panel is consistent  2. I do not think the slight elevation is of any great concern but repeat may further help us stratify the risk.  3. Discussed the risk of this with patient and has demonstrated understanding  4. I will see the patient back in 3 - 4 weeks times or sooner if need arises.   All questions were answered. The patient knows to call the clinic with any problems, questions or concerns. We can certainly see the patient much sooner if necessary.  Thank you so much for allowing me to participate in the care of [name]. I will continue to follow up the patient with you and assist in her care.  I spent 40 minutes counseling the patient face to face. The total time spent in the appointment was 60 minutes.  Germaine Ripp, MD  Medical/Oncology  [facility]  [phone] (beeper)  [phone] (Office)  10/26/2011, 1:57 PM  10/26/2011, 1:57 PM     

## 2011-10-26 NOTE — Telephone Encounter (Signed)
gve the pt her feb and march 2013 appts

## 2011-10-27 ENCOUNTER — Other Ambulatory Visit: Payer: Managed Care, Other (non HMO) | Admitting: Lab

## 2011-10-28 LAB — HYPERCOAGULABLE PANEL, COMPREHENSIVE
AntiThromb III Func: 108 % (ref 76–126)
Anticardiolipin IgA: 5 APL U/mL (ref <22)
Anticardiolipin IgM: 4 MPL U/mL (ref <11)
Beta-2-Glycoprotein I IgM: 1 M Units (ref <20)
DRVVT: 35.7 secs (ref 34.1–42.2)
PTT Lupus Anticoagulant: 37.9 secs (ref 28.0–43.0)
Protein S Activity: 101 % (ref 69–129)

## 2011-11-18 ENCOUNTER — Ambulatory Visit (HOSPITAL_BASED_OUTPATIENT_CLINIC_OR_DEPARTMENT_OTHER): Payer: Managed Care, Other (non HMO) | Admitting: Oncology

## 2011-11-18 ENCOUNTER — Telehealth: Payer: Self-pay | Admitting: Oncology

## 2011-11-18 ENCOUNTER — Encounter: Payer: Self-pay | Admitting: Oncology

## 2011-11-18 VITALS — BP 105/65 | HR 73 | Temp 98.9°F | Ht 69.0 in | Wt 154.4 lb

## 2011-11-18 DIAGNOSIS — R791 Abnormal coagulation profile: Secondary | ICD-10-CM

## 2011-11-18 DIAGNOSIS — N96 Recurrent pregnancy loss: Secondary | ICD-10-CM

## 2011-11-18 DIAGNOSIS — O2620 Pregnancy care for patient with recurrent pregnancy loss, unspecified trimester: Secondary | ICD-10-CM

## 2011-11-18 NOTE — Patient Instructions (Signed)
Results for [name], [name] (MRN [id number]) as of 11/18/2011 13:39  Ref. Range 10/26/2011 15:12  Anticardiolipin IgA Latest Range: <22 APL U/mL 5  Anticardiolipin IgG Latest Range: <23 GPL U/mL 5  Anticardiolipin IgM Latest Range: <11 MPL U/mL 4  PTT Lupus Anticoagulant Latest Range: 28.0-43.0 secs 37.9  PTTLA Confirmation Latest Range: <8.0 secs NOT APPL  PTTLA 4:1 Mix Latest Range: 28.0-43.0 secs NOT APPL  DRVVT Latest Range: 34.1-42.2 secs 35.7  Drvvt confirmation Latest Range: <1.16 Ratio NOT APPL  Lupus Anticoagulant Latest Range: NOT DETECTED  NOT DETECTED  Beta-2 Glyco I IgG Latest Range: <20 G Units 2  Beta-2-Glycoprotein I IgA Latest Range: <20 A Units 5  Beta-2-Glycoprotein I IgM Latest Range: <20 M Units 1  AntiThromb III Func Latest Range: 76-126 % 108  Recommendations-F5LEID: No range found *  Recommendations-PTGENE: No range found *  DRVVT 1:1 Mix Latest Range: 34.1-42.2 secs NOT APPL  Protein C Activity Latest Range: 75-133 % 149 (H)  Protein C, Total Latest Range: 72-160 % 97  Protein S Activity Latest Range: 69-129 % 101   1. Your lab results do not show any significant abnormalities in you hypercoag panel. The anti-thrombin III levels are normal. These labs do not give [place] an explanation for why you have had previous miscarriages.  2. Please give these results to you Ob.  3. I will see you back in 6 months or as needed

## 2011-11-18 NOTE — Progress Notes (Signed)
OFFICE PROGRESS NOTE  CC [name]  DIAGNOSIS: 26 year old female seen for workup for an elevated antithrombin III and recurrent miscarriages  PRIOR THERAPY:none  CURRENT THERAPY: undergoing workup for possible hypercoagulability leading to miscarriages  INTERVAL HISTORY: [name] 26 y.o. female returns for area did she was originally seen by me back in February 2013. She had a full hypercoag panel performed at that visit. The complete panel is unremarkable. Her antithrombin III level is now normal. Patient is currently [redacted] weeks pregnant her pregnancy so far is going well however she is tired which is understandable. She is denying having any problems with swelling in her lower extremities no headaches double vision or blurring of vision. Remainder of the 10 point review of systems is negative.  MEDICAL HISTORY: Past Medical History  Diagnosis Date  . Asthma 2011    pregnancy induced  . Depression 2011  . Incomplete miscarriage 03/24/2011    ALLERGIES:  is allergic to bee.  MEDICATIONS:  Current Outpatient Prescriptions  Medication Sig Dispense Refill  . acetaminophen (TYLENOL) 325 MG tablet Take 650 mg by mouth every 6 (six) hours as needed.      Marland Kitchen aspirin 81 MG tablet Take 81 mg by mouth daily.      . prenatal vitamin w/FE, FA (PRENATAL 1 + 1) 27-1 MG TABS Take 1 tablet by mouth daily.        . progesterone (PROMETRIUM) 100 MG capsule Take 150 mg by mouth Nightly.        SURGICAL HISTORY:  Past Surgical History  Procedure Date  . No past surgeries   . Dilation and evacuation 03/24/2011    Procedure: DILATATION AND EVACUATION (D&E);  Surgeon: Janine Limbo, MD;  Location: [facility];  Service: Gynecology;  Laterality: N/A;    REVIEW OF SYSTEMS:  Pertinent items are noted in HPI.   PHYSICAL EXAMINATION: not performed  ECOG PERFORMANCE STATUS: 0 - Asymptomatic  Blood pressure 105/65, pulse 73, temperature 98.9 F (37.2 C), temperature source Oral, height 5'  9" (1.753 m), weight 154 lb 6.4 oz (70.035 kg).  LABORATORY DATA: Lab Results  Component Value Date   WBC 8.4 10/26/2011   HGB 14.1 10/26/2011   HCT 41.0 10/26/2011   MCV 86.4 10/26/2011   PLT 221 10/26/2011      Chemistry      Component Value Date/Time   NA 139 10/26/2011 1313   K 3.8 10/26/2011 1313   CL 107 10/26/2011 1313   CO2 21 10/26/2011 1313   BUN 8 10/26/2011 1313   CREATININE 0.65 10/26/2011 1313      Component Value Date/Time   CALCIUM 9.4 10/26/2011 1313   ALKPHOS 47 10/26/2011 1313   AST 13 10/26/2011 1313   ALT 12 10/26/2011 1313   BILITOT 0.4 10/26/2011 1313    Results for [name] (MRN [id number]) as of 11/18/2011 19:08  Ref. Range 10/26/2011 15:12  Anticardiolipin IgA Latest Range: <22 APL U/mL 5  Anticardiolipin IgG Latest Range: <23 GPL U/mL 5  Anticardiolipin IgM Latest Range: <11 MPL U/mL 4  PTT Lupus Anticoagulant Latest Range: 28.0-43.0 secs 37.9  PTTLA Confirmation Latest Range: <8.0 secs NOT APPL  PTTLA 4:1 Mix Latest Range: 28.0-43.0 secs NOT APPL  DRVVT Latest Range: 34.1-42.2 secs 35.7  Drvvt confirmation Latest Range: <1.16 Ratio NOT APPL  Lupus Anticoagulant Latest Range: NOT DETECTED  NOT DETECTED  Beta-2 Glyco I IgG Latest Range: <20 G Units 2  Beta-2-Glycoprotein I IgA Latest Range: <20 A  Units 5  Beta-2-Glycoprotein I IgM Latest Range: <20 M Units 1  AntiThromb III Func Latest Range: 76-126 % 108  Recommendations-F5LEID: No range found *  Recommendations-PTGENE: No range found *  DRVVT 1:1 Mix Latest Range: 34.1-42.2 secs NOT APPL  Protein C Activity Latest Range: 75-133 % 149 (H)  Protein C, Total Latest Range: 72-160 % 97  Protein S Activity Latest Range: 69-129 % 101     RADIOGRAPHIC STUDIES:  No results found.  ASSESSMENT: 26 year old female with previous history of multiple miscarriages. Patient recently was found to be pregnant about 8 weeks. A hypercoag panel was performed and at Dr. Juliene Pina these office she had a elevated  antithrombin III. Rest of her hypoechoic panel is negative. Because of this patient was was for her to hematology for discussion of of the cause is possible cause of thrombophilia as leading to miscarriages. I have repeated her panel here and all of her results are normal including antithrombin III. Of note protein C  activity a slightly elevated at 149 but this may just be due to her gestational stage. Clinically patient seems to be doing well.   PLAN: based on patient's results there is no clear explanation of why patient has had previous miscarriages. However of note patient has had 2 successful pregnancies with 2 beautiful daughters. She is now pregnant with her fifth pregnancy. Unfortunately the workup that I have done so far is uninformative as to  what the cause of her previous miscarriages or. I suspect it is not do to antithrombin III or any kind of a hypercoagulable state.Cindi Carbon based on this I do not see a clear cut reason to place the patient on prophylactic anticoagulation therefore duration of her pregnancy. However if the index of suspicion for this is high and if the patient truly is at high risk for miscarriages certainly we could empirically place her on anticoagulation with Lovenox on a prophylactic dose. Her is not so enthusiastic about having to give her injections. I discussed the risks and benefits certainly she will be speaking to her obstetrician regarding this as well. If a decision is made that she wants to be on Lovenox then I certainly would be happy the anticoagulant therapy through out her pregnancy.   All questions were answered. The patient knows to call the clinic with any problems, questions or concerns. We can certainly see the patient much sooner if necessary.  I spent 25 minutes counseling the patient face to face. The total time spent in the appointment was 30 minutes.    Drue Second, MD Medical/Oncology [facility] [phone]  (beeper) [phone] (Office)  11/18/2011, 7:05 PM

## 2011-11-25 LAB — OB RESULTS CONSOLE HIV ANTIBODY (ROUTINE TESTING): HIV: NONREACTIVE

## 2011-11-25 LAB — OB RESULTS CONSOLE ABO/RH

## 2011-11-25 LAB — OB RESULTS CONSOLE RUBELLA ANTIBODY, IGM: Rubella: IMMUNE

## 2011-12-02 LAB — OB RESULTS CONSOLE GC/CHLAMYDIA
Chlamydia: NEGATIVE
Gonorrhea: NEGATIVE

## 2012-04-28 ENCOUNTER — Encounter (HOSPITAL_COMMUNITY): Payer: Self-pay | Admitting: Emergency Medicine

## 2012-04-28 ENCOUNTER — Emergency Department (HOSPITAL_COMMUNITY)
Admission: EM | Admit: 2012-04-28 | Discharge: 2012-04-28 | Disposition: A | Discharge status: Home / Self Care | Payer: Managed Care, Other (non HMO) | Attending: Emergency Medicine | Admitting: Emergency Medicine

## 2012-04-28 DIAGNOSIS — O98819 Other maternal infectious and parasitic diseases complicating pregnancy, unspecified trimester: Secondary | ICD-10-CM | POA: Insufficient documentation

## 2012-04-28 DIAGNOSIS — L0291 Cutaneous abscess, unspecified: Secondary | ICD-10-CM

## 2012-04-28 DIAGNOSIS — L03119 Cellulitis of unspecified part of limb: Secondary | ICD-10-CM | POA: Insufficient documentation

## 2012-04-28 DIAGNOSIS — L02419 Cutaneous abscess of limb, unspecified: Secondary | ICD-10-CM | POA: Insufficient documentation

## 2012-04-28 MED ORDER — CEPHALEXIN 500 MG PO CAPS: 500.0000 mg | ORAL_CAPSULE | Freq: Four times a day (QID) | ORAL | Status: AC

## 2012-04-28 NOTE — ED Notes (Signed)
Pt alert, arrives from home, c/o wound to inner left leg, onset was today, 3 cm red area, pin point white center, non open non draining, resp even unlabored, skin pwd

## 2012-04-28 NOTE — ED Notes (Signed)
Pt reports she might have been bit by a spider and only noticed at 1200 yesterday.

## 2012-04-28 NOTE — ED Notes (Signed)
Wound culture material by bedside on top of suture cart.

## 2012-04-28 NOTE — ED Provider Notes (Addendum)
History     CSN: [id number]  Arrival date & time 04/28/12  0406   First MD Initiated Contact with Patient 04/28/12 [phone]      Chief Complaint  Patient presents with  . Wound Check    (Consider location/radiation/quality/duration/timing/severity/associated sxs/prior treatment) HPI Comments: Pt comes in with cc of wound check. Pt is pregnant, 3rd trimester. Pt has no hx of cellulitis, abscess. Pt has no n/v/f/c.  Patient is a 26 y.o. female presenting with wound check. The history is provided by the patient.  Wound Check     Past Medical History  Diagnosis Date  . Asthma 2011    pregnancy induced  . Depression 2011  . Incomplete miscarriage 03/24/2011    Past Surgical History  Procedure Date  . No past surgeries   . Dilation and evacuation 03/24/2011    Procedure: DILATATION AND EVACUATION (D&E);  Surgeon: Janine Limbo, MD;  Location: [facility];  Service: Gynecology;  Laterality: N/A;    No family history on file.  History  Substance Use Topics  . Smoking status: Never Smoker   . Smokeless tobacco: Not on file  . Alcohol Use: Yes     not since pregnancy    OB History    Grav Para Term Preterm Abortions TAB SAB Ect Mult Living   4 3 3  0 0 0 0 0 0 2      Review of Systems  Constitutional: Positive for activity change.  HENT: Negative for neck pain.   Respiratory: Negative for shortness of breath.   Cardiovascular: Negative for chest pain.  Gastrointestinal: Negative for nausea, vomiting and abdominal pain.  Genitourinary: Negative for dysuria.  Skin: Positive for rash.  Neurological: Negative for headaches.    Allergies  Bee venom  Home Medications   Current Outpatient Rx  Name Route Sig Dispense Refill  . ACETAMINOPHEN 325 MG PO TABS Oral Take 650 mg by mouth every 6 (six) hours as needed. Pain    . ASPIRIN 81 MG PO TABS Oral Take 81 mg by mouth daily.    Marland Kitchen DIPHENHYDRAMINE HCL 25 MG PO TABS Oral Take 25 mg by mouth every 6 (six) hours as needed.     Marland Kitchen PRENATAL PLUS 27-1 MG PO TABS Oral Take 1 tablet by mouth daily.      Marland Kitchen DIPHENHYDRAMINE HCL 25 MG PO CAPS Oral Take 50 mg by mouth as needed. Patient took twice after she was stung (allergy)    . DIPHENHYDRAMINE HCL 25 MG PO CAPS Oral Take 2 capsules (50 mg total) by mouth as needed. Patient took twice after she was stung (allergy) 30 capsule 0    BP 110/67  Pulse 105  Temp 97 F (36.1 C) (Oral)  Resp 16  SpO2 99%  Physical Exam  Nursing note and vitals reviewed. Constitutional: She is oriented to person, place, and time. She appears well-developed and well-nourished.  HENT:  Head: Normocephalic and atraumatic.  Eyes: EOM are normal. Pupils are equal, round, and reactive to light.  Neck: Neck supple.  Cardiovascular: Normal rate, regular rhythm and normal heart sounds.   No murmur heard. Pulmonary/Chest: Effort normal. No respiratory distress.  Abdominal: Soft. She exhibits no distension. There is no tenderness. There is no rebound and no guarding.  Neurological: She is alert and oriented to person, place, and time.  Skin: Skin is warm and dry.       6 cm lesion, with pustule at the top. There is erythema, + induration.  ED Course  INCISION AND DRAINAGE Date/Time: 04/28/2012 7:43 AM Performed by: Derwood Kaplan Authorized by: Derwood Kaplan Consent: Verbal consent obtained. Risks and benefits: risks, benefits and alternatives were discussed Consent given by: patient Patient understanding: patient states understanding of the procedure being performed Patient identity confirmed: verbally with patient Time out: Immediately prior to procedure a "time out" was called to verify the correct patient, procedure, equipment, support staff and site/side marked as required. Type: abscess Body area: lower extremity Location details: left leg Anesthesia: local infiltration Local anesthetic: lidocaine 1% with epinephrine Anesthetic total: 5 ml Patient sedated: no Scalpel size:  11 Needle gauge: 20 Incision type: single straight Complexity: simple Drainage: bloody and purulent Drainage amount: scant Wound treatment: wound left open and drain placed Packing material: 1/2 in iodoform gauze Patient tolerance: Patient tolerated the procedure well with no immediate complications.   (including critical care time)  Labs Reviewed - No data to display No results found.   No diagnosis found.    MDM  DDX; Abscess Cellulitis  Pregnant patient comes in with cc of rash. There is an abscess, with surrounding cellulitis. Will drain. Will culture since patient is pregnant.  Will d/c on keflex since bactrim is class D.        Derwood Kaplan, MD 04/28/12 9811  Derwood Kaplan, MD 04/28/12 [phone]

## 2012-04-28 NOTE — ED Notes (Signed)
MD at bedside. 

## 2012-04-30 LAB — WOUND CULTURE: Special Requests: NORMAL

## 2012-05-23 ENCOUNTER — Ambulatory Visit: Payer: Managed Care, Other (non HMO) | Admitting: Oncology

## 2012-05-30 LAB — OB RESULTS CONSOLE GBS: GBS: NEGATIVE

## 2012-06-03 ENCOUNTER — Inpatient Hospital Stay (HOSPITAL_COMMUNITY)
Admission: AD | Admit: 2012-06-03 | Discharge: 2012-06-03 | Disposition: A | Discharge status: Home / Self Care | Payer: Managed Care, Other (non HMO) | Source: Ambulatory Visit | Attending: Obstetrics and Gynecology | Admitting: Obstetrics and Gynecology

## 2012-06-03 ENCOUNTER — Encounter (HOSPITAL_COMMUNITY): Payer: Self-pay | Admitting: *Deleted

## 2012-06-03 DIAGNOSIS — O368190 Decreased fetal movements, unspecified trimester, not applicable or unspecified: Secondary | ICD-10-CM | POA: Insufficient documentation

## 2012-06-03 NOTE — MAU Note (Signed)
PT SAYS    LAST CHILD  WAS MOVING  AND WHEN  SHE WENT TO DR OFFICE - NO FHR.     THIS BABY IS DOING SAME  AND PT IS VERY CONCERNED.    IN TRIAGE- [id number]  AND PT FEELS BABY MOVING. DENIES HSV AND MRSA.

## 2012-06-10 ENCOUNTER — Other Ambulatory Visit: Payer: Self-pay | Admitting: Obstetrics & Gynecology

## 2012-06-13 ENCOUNTER — Telehealth (HOSPITAL_COMMUNITY): Payer: Self-pay | Admitting: *Deleted

## 2012-06-13 ENCOUNTER — Encounter (HOSPITAL_COMMUNITY): Payer: Self-pay | Admitting: *Deleted

## 2012-06-13 NOTE — Telephone Encounter (Signed)
Preadmission screen  

## 2012-06-16 ENCOUNTER — Inpatient Hospital Stay (HOSPITAL_COMMUNITY)
Admission: RE | Admit: 2012-06-16 | Discharge: 2012-06-18 | DRG: 775 | Disposition: A | Discharge status: Home / Self Care | Payer: Managed Care, Other (non HMO) | Source: Ambulatory Visit | Attending: Obstetrics & Gynecology | Admitting: Obstetrics & Gynecology

## 2012-06-16 ENCOUNTER — Encounter (HOSPITAL_COMMUNITY): Payer: Self-pay | Admitting: Anesthesiology

## 2012-06-16 ENCOUNTER — Inpatient Hospital Stay (HOSPITAL_COMMUNITY): Payer: Managed Care, Other (non HMO) | Admitting: Anesthesiology

## 2012-06-16 ENCOUNTER — Encounter (HOSPITAL_COMMUNITY): Payer: Self-pay

## 2012-06-16 DIAGNOSIS — N96 Recurrent pregnancy loss: Secondary | ICD-10-CM

## 2012-06-16 DIAGNOSIS — O09299 Supervision of pregnancy with other poor reproductive or obstetric history, unspecified trimester: Secondary | ICD-10-CM

## 2012-06-16 LAB — CBC
HCT: 39.8 % (ref 36.0–46.0)
MCH: 30.9 pg (ref 26.0–34.0)
MCV: 89 fL (ref 78.0–100.0)
RDW: 13 % (ref 11.5–15.5)
WBC: 8.5 10*3/uL (ref 4.0–10.5)

## 2012-06-16 MED ORDER — OXYTOCIN 40 UNITS IN LACTATED RINGERS INFUSION - SIMPLE MED
62.5000 mL/h | Freq: Once | INTRAVENOUS | Status: AC
  Administered 2012-06-16: 62.5 mL/h via INTRAVENOUS

## 2012-06-16 MED ORDER — TETANUS-DIPHTH-ACELL PERTUSSIS 5-2.5-18.5 LF-MCG/0.5 IM SUSP: 0.5000 mL | Freq: Once | INTRAMUSCULAR | Status: DC

## 2012-06-16 MED ORDER — DIPHENHYDRAMINE HCL 50 MG/ML IJ SOLN: 12.5000 mg | INTRAMUSCULAR | Status: DC | PRN

## 2012-06-16 MED ORDER — ONDANSETRON HCL 4 MG PO TABS: 4.0000 mg | ORAL_TABLET | ORAL | Status: DC | PRN

## 2012-06-16 MED ORDER — WITCH HAZEL-GLYCERIN EX PADS: 1.0000 "application " | MEDICATED_PAD | CUTANEOUS | Status: DC | PRN

## 2012-06-16 MED ORDER — LACTATED RINGERS IV SOLN
INTRAVENOUS | Status: DC
  Administered 2012-06-16 (×2): via INTRAVENOUS

## 2012-06-16 MED ORDER — ONDANSETRON HCL 4 MG/2ML IJ SOLN: 4.0000 mg | Freq: Four times a day (QID) | INTRAMUSCULAR | Status: DC | PRN

## 2012-06-16 MED ORDER — ONDANSETRON HCL 4 MG/2ML IJ SOLN: 4.0000 mg | INTRAMUSCULAR | Status: DC | PRN

## 2012-06-16 MED ORDER — PRENATAL MULTIVITAMIN CH
1.0000 | ORAL_TABLET | Freq: Every day | ORAL | Status: DC
  Administered 2012-06-17 – 2012-06-18 (×2): 1 via ORAL
  Filled 2012-06-16 (×2): qty 1

## 2012-06-16 MED ORDER — DIBUCAINE 1 % RE OINT: 1.0000 "application " | TOPICAL_OINTMENT | RECTAL | Status: DC | PRN

## 2012-06-16 MED ORDER — IBUPROFEN 600 MG PO TABS
600.0000 mg | ORAL_TABLET | Freq: Four times a day (QID) | ORAL | Status: DC
  Administered 2012-06-16 – 2012-06-18 (×7): 600 mg via ORAL
  Filled 2012-06-16 (×7): qty 1

## 2012-06-16 MED ORDER — LIDOCAINE HCL (PF) 1 % IJ SOLN: 30.0000 mL | INTRAMUSCULAR | Status: DC | PRN

## 2012-06-16 MED ORDER — PHENYLEPHRINE 40 MCG/ML (10ML) SYRINGE FOR IV PUSH (FOR BLOOD PRESSURE SUPPORT)
80.0000 ug | PREFILLED_SYRINGE | INTRAVENOUS | Status: DC | PRN
  Filled 2012-06-16: qty 5

## 2012-06-16 MED ORDER — ZOLPIDEM TARTRATE 5 MG PO TABS: 5.0000 mg | ORAL_TABLET | Freq: Every evening | ORAL | Status: DC | PRN

## 2012-06-16 MED ORDER — BENZOCAINE-MENTHOL 20-0.5 % EX AERO: 1.0000 "application " | INHALATION_SPRAY | CUTANEOUS | Status: DC | PRN

## 2012-06-16 MED ORDER — OXYTOCIN 40 UNITS IN LACTATED RINGERS INFUSION - SIMPLE MED
1.0000 m[IU]/min | INTRAVENOUS | Status: DC
  Administered 2012-06-16: 2 m[IU]/min via INTRAVENOUS
  Filled 2012-06-16: qty 1000

## 2012-06-16 MED ORDER — FENTANYL 2.5 MCG/ML BUPIVACAINE 1/10 % EPIDURAL INFUSION (WH - ANES)
INTRAMUSCULAR | Status: DC | PRN
  Administered 2012-06-16: 14 mL/h via EPIDURAL

## 2012-06-16 MED ORDER — OXYCODONE-ACETAMINOPHEN 5-325 MG PO TABS: 1.0000 | ORAL_TABLET | ORAL | Status: DC | PRN

## 2012-06-16 MED ORDER — EPHEDRINE 5 MG/ML INJ
10.0000 mg | INTRAVENOUS | Status: DC | PRN
  Filled 2012-06-16: qty 4

## 2012-06-16 MED ORDER — OXYCODONE-ACETAMINOPHEN 5-325 MG PO TABS
1.0000 | ORAL_TABLET | ORAL | Status: DC | PRN
  Administered 2012-06-17: 1 via ORAL
  Filled 2012-06-16: qty 1

## 2012-06-16 MED ORDER — FENTANYL 2.5 MCG/ML BUPIVACAINE 1/10 % EPIDURAL INFUSION (WH - ANES)
14.0000 mL/h | INTRAMUSCULAR | Status: DC
  Filled 2012-06-16: qty 125

## 2012-06-16 MED ORDER — DIPHENHYDRAMINE HCL 25 MG PO CAPS: 25.0000 mg | ORAL_CAPSULE | Freq: Four times a day (QID) | ORAL | Status: DC | PRN

## 2012-06-16 MED ORDER — ACETAMINOPHEN 325 MG PO TABS: 650.0000 mg | ORAL_TABLET | ORAL | Status: DC | PRN

## 2012-06-16 MED ORDER — SENNOSIDES-DOCUSATE SODIUM 8.6-50 MG PO TABS
2.0000 | ORAL_TABLET | Freq: Every day | ORAL | Status: DC
  Administered 2012-06-16 – 2012-06-17 (×2): 2 via ORAL

## 2012-06-16 MED ORDER — LIDOCAINE HCL (PF) 1 % IJ SOLN
INTRAMUSCULAR | Status: DC | PRN
  Administered 2012-06-16 (×2): 9 mL

## 2012-06-16 MED ORDER — CITRIC ACID-SODIUM CITRATE 334-500 MG/5ML PO SOLN: 30.0000 mL | ORAL | Status: DC | PRN

## 2012-06-16 MED ORDER — LANOLIN HYDROUS EX OINT: TOPICAL_OINTMENT | CUTANEOUS | Status: DC | PRN

## 2012-06-16 MED ORDER — LACTATED RINGERS IV SOLN: 500.0000 mL | Freq: Once | INTRAVENOUS | Status: DC

## 2012-06-16 MED ORDER — EPHEDRINE 5 MG/ML INJ: 10.0000 mg | INTRAVENOUS | Status: DC | PRN

## 2012-06-16 MED ORDER — IBUPROFEN 600 MG PO TABS: 600.0000 mg | ORAL_TABLET | Freq: Four times a day (QID) | ORAL | Status: DC | PRN

## 2012-06-16 MED ORDER — SIMETHICONE 80 MG PO CHEW: 80.0000 mg | CHEWABLE_TABLET | ORAL | Status: DC | PRN

## 2012-06-16 MED ORDER — LACTATED RINGERS IV SOLN: 500.0000 mL | INTRAVENOUS | Status: DC | PRN

## 2012-06-16 MED ORDER — OXYTOCIN BOLUS FROM INFUSION
500.0000 mL | Freq: Once | INTRAVENOUS | Status: DC
  Filled 2012-06-16: qty 500

## 2012-06-16 MED ORDER — TERBUTALINE SULFATE 1 MG/ML IJ SOLN: 0.2500 mg | Freq: Once | INTRAMUSCULAR | Status: DC | PRN

## 2012-06-16 MED ORDER — PHENYLEPHRINE 40 MCG/ML (10ML) SYRINGE FOR IV PUSH (FOR BLOOD PRESSURE SUPPORT): 80.0000 ug | PREFILLED_SYRINGE | INTRAVENOUS | Status: DC | PRN

## 2012-06-16 NOTE — Anesthesia Preprocedure Evaluation (Signed)
Anesthesia Evaluation  Patient identified by MRN, date of birth, ID band Patient awake    Reviewed: Allergy & Precautions, H&P , NPO status , Patient's Chart, lab work & pertinent test results  Airway Mallampati: II TM Distance: >3 FB Neck ROM: full    Dental No notable dental hx.    Pulmonary    Pulmonary exam normal       Cardiovascular negative cardio ROS      Neuro/Psych negative neurological ROS  negative psych ROS   GI/Hepatic negative GI ROS, Neg liver ROS,   Endo/Other  negative endocrine ROS  Renal/GU negative Renal ROS  negative genitourinary   Musculoskeletal negative musculoskeletal ROS (+)   Abdominal Normal abdominal exam  (+)   Peds negative pediatric ROS (+)  Hematology negative hematology ROS (+)   Anesthesia Other Findings   Reproductive/Obstetrics (+) Pregnancy                           Anesthesia Physical Anesthesia Plan  ASA: II  Anesthesia Plan: Epidural   Post-op Pain Management:    Induction:   Airway Management Planned:   Additional Equipment:   Intra-op Plan:   Post-operative Plan:   Informed Consent: I have reviewed the patients History and Physical, chart, labs and discussed the procedure including the risks, benefits and alternatives for the proposed anesthesia with the patient or authorized representative who has indicated his/her understanding and acceptance.     Plan Discussed with:   Anesthesia Plan Comments:         Anesthesia Quick Evaluation  

## 2012-06-16 NOTE — H&P (Addendum)
[name] is a 26 y.o. female presenting for labor induction due to prior term IUFD with unexplained cause.  Pt is has had regular [facility], normal serial growth sonograms since 28 wks and antenatal testing from  32 wks (wk'ly BPP and NST added at 36 wks) and have been normal. Prometrium in 1st trimester and baby ASA until 36 wks. [id number] (SVD, SVD, term IUFD, 13 wk  MAB). Neg thrombophilia w/up  History OB History    Grav Para Term Preterm Abortions TAB SAB Ect Mult Living   5 3 3  0 1 0 1 0 0 2     Past Medical History  Diagnosis Date  . Asthma 2011    pregnancy induced  . Depression 2011  . Incomplete miscarriage 03/24/2011  . Hx of intrauterine growth retardation and stillbirth, currently pregnant   . UTI (lower urinary tract infection)   . Normal labor 06/16/2012   Past Surgical History  Procedure Date  . No past surgeries   . Dilation and evacuation 03/24/2011    Procedure: DILATATION AND EVACUATION (D&E);  Surgeon: Janine Limbo, MD;  Location: [facility];  Service: Gynecology;  Laterality: N/A;   Family History: family history includes Diabetes in her maternal grandfather; Dwarfism in her sister; and Heart attack in her paternal grandfather. Social History:  reports that she has never smoked. She does not have any smokeless tobacco history on file. She reports that she drinks alcohol. She reports that she does not use illicit drugs.   Prenatal Transfer Tool  Maternal Diabetes: No Genetic Screening: Normal Maternal Ultrasounds/Referrals: Normal Fetal Ultrasounds or other Referrals:  None Maternal Substance Abuse:  No Significant Maternal Medications:  Prometrium in 1st trim and baby ASA until 36 wks.  Significant Maternal Lab Results:  Lab values include: Group B Strep negative Other Comments:  prior full term stillbirth with negative postpartum work up.    Review of Systems  Constitutional: Negative for fever.  Eyes: Negative for blurred vision.  Respiratory:  Negative for cough.   Cardiovascular: Negative for chest pain.  Gastrointestinal: Negative for heartburn.  Genitourinary: Negative for dysuria.    Dilation: 1.5 Effacement (%): 50 Station: -2 Exam by:: J.Cox, RN Blood pressure 109/69, pulse 99, temperature 98.2 F (36.8 C), temperature source Oral, height 5\' 6"  (1.676 m), weight 193 lb (87.544 kg). Exam Physical Exam   A&O x 3, no acute distress. Pleasant HEENT neg, no thyromegaly Lungs CTA bilat CV RRR, A1S2 normal Abdo soft, non tender, non acute Extr no edema/ tenderness Pelvic above  FHT  130s/ + accels/ no decels/ moderate variab- category I Toco rare at admission.   Prenatal labs: ABO, Rh: O/Positive/-- (03/20 0000) Antibody: Negative (03/20 0000) Rubella: Immune (03/20 0000) RPR: Nonreactive (03/20 0000)  HBsAg: Negative (03/20 0000)  HIV: Non-reactive (03/20 0000)  GBS: Negative (09/23 0000)  Glucola nl Anatomy sono and serial f/up growth normal   Assessment/Plan: 26 yo, [id number] with term IUFD hx, here for IOL.  Pitocin as tolerated. GBS neg. Ok for epidural, AROM when ready. EFW 7.1/2 lbs   [name] R 06/16/2012, 1:09 PM

## 2012-06-16 NOTE — Progress Notes (Addendum)
ALISABETH SELKIRK is a 26 y.o. [id number] at [redacted]w[redacted]d by 1st trim sono. Here for IOL due to term stillbirth hx.  Subjective: Not feeling UCs or pain. Anxious due to stillbirth in last pregnancy.  Objective: BP 103/66  Pulse 92  Temp 98.2 F (36.8 C) (Oral)  Ht 5\' 6"  (1.676 m)  Wt 193 lb (87.544 kg)  BMI 31.15 kg/m2  FHT: 130s/ + accels. No decels/ moderate variability Category I   UC:  q 3 min, pitocin.   SVE:   Dilation: 1.5 Effacement (%): 50 Station: -3 Exam by:: Thersia Petraglia Controlled AROM since floating head, small amniotomy and fundal pressure, copious clear AF. Head was still at -3 but well applied to cervix.   Assessment / Plan: IOL, latent labor, continue Pitocin. GBS neg. FHT - category I. Station high but prior 9+ lb SVD. EFW 8 lbs.   Braylynn Lewing R 06/16/2012, 2:34 PM

## 2012-06-16 NOTE — Anesthesia Procedure Notes (Signed)
Epidural Patient location during procedure: OB Start time: 06/16/2012 4:01 PM End time: 06/16/2012 4:04 PM  Staffing Anesthesiologist: Sandrea Hughs Performed by: anesthesiologist   Preanesthetic Checklist Completed: patient identified, site marked, surgical consent, pre-op evaluation, timeout performed, IV checked, risks and benefits discussed and monitors and equipment checked  Epidural Patient position: sitting Prep: site prepped and draped and DuraPrep Patient monitoring: continuous pulse ox and blood pressure Approach: midline Injection technique: LOR air  Needle:  Needle type: Tuohy  Needle gauge: 17 G Needle length: 9 cm and 9 Needle insertion depth: 4 cm Catheter type: closed end flexible Catheter size: 19 Gauge Catheter at skin depth: 9 cm Test dose: negative and Other  Assessment Sensory level: T8 Events: blood not aspirated, injection not painful, no injection resistance, negative IV test and no paresthesia  Additional Notes Reason for block:procedure for pain

## 2012-06-16 NOTE — Progress Notes (Signed)
Received report from Janna Arch, RN.

## 2012-06-17 ENCOUNTER — Encounter (HOSPITAL_COMMUNITY): Payer: Self-pay

## 2012-06-17 LAB — CBC
Hemoglobin: 13.2 g/dL (ref 12.0–15.0)
MCH: 30.8 pg (ref 26.0–34.0)
Platelets: 185 10*3/uL (ref 150–400)
RBC: 4.28 MIL/uL (ref 3.87–5.11)
WBC: 9.6 10*3/uL (ref 4.0–10.5)

## 2012-06-17 NOTE — Progress Notes (Signed)
Patient ID: [name], female   DOB: 10/06/1985, 26 y.o.   MRN: [id number] PPD # 1  Subjective: Pt reports feeling well, tired and sore/ Pain controlled with motrin and percocet Tolerating po/ Voiding without problems/ No n/v Bleeding is moderate Newborn info:  Information for the patient's newborn:  Jaimee, Tyrer [[id number]]  female Feeding: breast   Objective:  VS: Blood pressure 104/73, pulse 83, temperature 97.7 F (36.5 C), temperature source Oral, resp. rate 18, height 5\' 6"  (1.676 m), weight 87.544 kg (193 lb), SpO2 100.00%, unknown if currently breastfeeding.    Basename 06/17/12 0520 06/16/12 0829  WBC 9.6 8.5  HGB 13.2 13.8  HCT 38.5 39.8  PLT 185 202    Blood type: O/Positive/-- (03/20 0000) Rubella: Immune (03/20 0000)    Physical Exam:  General: A & O x 3  alert, cooperative and no distress CV: Regular rate and rhythm Resp: clear Abdomen: soft, nontender, normal bowel sounds Uterine Fundus: firm, below umbilicus, nontender Perineum: intact; mild edema Lochia: moderate Ext: edema trace and Homans sign is negative, no sign of DVT   A/P: PPD # 1/ [id number]/ S/P:spontaneous vaginal delivery Hx IUFD @ term with previous delivery; denies anxiety and verbalizes feeling emotionally stable at present Doing well Continue routine post partum orders Anticipate D/C home in AM    Demetrius Revel, MSN, [facility] 06/17/2012, 10:13 AM

## 2012-06-17 NOTE — Clinical Social Work Psychosocial (Signed)
    Clinical Social Work Department BRIEF PSYCHOSOCIAL ASSESSMENT 06/17/2012  Patient:  [name], [name]     Account Number:  [id number]     Admit date:  06/16/2012  Clinical Social Worker:  Almeta Monas  Date/Time:  06/17/2012 12:30 PM  Referred by:  RN  Date Referred:  06/17/2012 Referred for  Behavioral Health Issues   Other Referral:   Interview type:  Family Other interview type:    PSYCHOSOCIAL DATA Living Status:  FAMILY Admitted from facility:   Level of care:   Primary support name:  Fabiola Backer Primary support relationship to patient:  SPOUSE Degree of support available:   Good supports    CURRENT CONCERNS Current Concerns  None Noted   Other Concerns:    SOCIAL WORK ASSESSMENT / PLAN SW met with MOB to complete assessment for referral of hx of Depression.  MOB was tending to the baby and her other two children and seemed very calm.  She welcomed SW in and said we could talk with family members present.  Her husband and his mother were with her as well as her two older daughters.  SW disucussed hx of depression and signs and symptoms of PPD to watch for.  SW has no social concerns at this time.   Assessment/plan status:  No Further Intervention Required Other assessment/ plan:   Information/referral to community resources:   None identified    PATIENT'S/FAMILY'S RESPONSE TO PLAN OF CARE: MOB states everything has gone well for her this hospitalization and reports no needs, questions or concerns.  She denied and history of depression and states no experience with PPD after her other children.  She thanked SW for coming to check on her and states she feels comfortable talking with her doctor if symptoms arise.

## 2012-06-17 NOTE — Anesthesia Postprocedure Evaluation (Signed)
  Anesthesia Post-op Note  Patient: [name]  Procedure(s) Performed: * No procedures listed *  Patient Location: Mother/Baby  Anesthesia Type: Epidural  Level of Consciousness: awake, alert  and oriented  Airway and Oxygen Therapy: Patient Spontanous Breathing  Post-op Pain: none  Post-op Assessment: Post-op Vital signs reviewed and Patient's Cardiovascular Status Stable  Post-op Vital Signs: Reviewed and stable  Complications: No apparent anesthesia complications

## 2012-06-18 ENCOUNTER — Encounter (HOSPITAL_COMMUNITY): Payer: Self-pay

## 2012-06-18 MED ORDER — IBUPROFEN 600 MG PO TABS: 600.0000 mg | ORAL_TABLET | Freq: Four times a day (QID) | ORAL | Status: DC

## 2012-06-18 NOTE — Discharge Summary (Signed)
Obstetric Discharge Summary Reason for Admission: induction of labor and hx IUFD at term Prenatal Procedures: NST and ultrasound Intrapartum Procedures: spontaneous vaginal delivery and epidural Postpartum Procedures: none Complications-Operative and Postpartum: none HGB  Date Value Range Status  10/26/2011 14.1  11.6 - 15.9 g/dL Final     Hemoglobin  Date Value Range Status  06/17/2012 13.2  12.0 - 15.0 g/dL Final     HCT  Date Value Range Status  06/17/2012 38.5  36.0 - 46.0 % Final  10/26/2011 41.0  34.8 - 46.6 % Final    Physical Exam:  General: alert, cooperative and no distress Lochia: appropriate Uterine Fundus: firm Incision: n/a DVT Evaluation: Negative Homan's sign. No significant calf/ankle edema.  Discharge Diagnoses: Term Pregnancy-delivered  Discharge Information: Date: 06/18/2012 Activity: pelvic rest Diet: routine Medications: PNV and Ibuprofen Condition: stable Instructions: refer to practice specific booklet Discharge to: home Follow-up Information    Follow up with MODY,VAISHALI R, MD. Schedule an appointment as soon as possible for a visit in 6 weeks.   Contact information:   [address] [place] [address] [phone]          Newborn Data: Live born female  Birth Weight: 7 lb 7.1 oz (3375 g) APGAR: 9, 9  Home with mother.  [name] 06/18/2012, 9:05 AM

## 2012-06-18 NOTE — Progress Notes (Signed)
Post Partum Day #2            Information for the patient's newborn:  Jodye, Scali [[id number]]  female   Feeding: breast  Subjective: No HA, SOB, CP, F/C, breast symptoms. Pain minimal. Normal vaginal bleeding, no clots.      Objective:  Temp:  [97.5 F (36.4 C)-98.2 F (36.8 C)] 98.2 F (36.8 C) (10/12 0530) Pulse Rate:  [79-81] 79  (10/12 0530) Resp:  [18] 18  (10/12 0530) BP: (101-105)/(66-73) 101/66 mmHg (10/12 0530) SpO2:  [97 %] 97 % (10/11 2109)  No intake or output data in the 24 hours ending 06/18/12 0854     Basename 06/17/12 0520 06/16/12 0829  WBC 9.6 8.5  HGB 13.2 13.8  HCT 38.5 39.8  PLT 185 202    Blood type: O/Positive/-- (03/20 0000) Rubella: Immune (03/20 0000)    Physical Exam:  General: alert, cooperative and no distress Uterine Fundus: firm Lochia: appropriate Perineum: intact, edema none DVT Evaluation: Negative Homan's sign. No significant calf/ankle edema.    Assessment/Plan: PPD # 2 / 26 y.o., [id number] S/P:induced vaginal   Principal Problem:  *Postpartum care following vaginal delivery (06/16/12) TDaP up to date, declines flu vaccine   normal postpartum exam  Continue current postpartum care  D/C home   LOS: 2 days   Mekiah Wahler, CNM, MSN 06/18/2012, 8:54 AM

## 2012-06-22 NOTE — Discharge Summary (Signed)
Reviewed and agree with note V.Maika Kaczmarek, MD 

## 2013-09-07 NOTE — L&D Delivery Note (Signed)
Delivery Note At 10:09 PM a viable and healthy female was delivered via Vaginal, Spontaneous Delivery (Presentation: Middle Occiput Posterior).  APGAR: 8, 9; weight 7 lb 11.8 oz (3510 g).   Placenta status: Intact, Spontaneous.  Cord: 3 vessels with the following complications: None.  Cord pH: n/a  Anesthesia: Epidural  Episiotomy: None Lacerations: None Est. Blood Loss (mL): 400. Cytotec 1000 mcg placed per rectum prophylactical after placental delivery. Methergine 0.2 mg IM given due to increased bleeding.   Mom to postpartum.  Baby to Couplet care / Skin to Skin.  KEEP NPO after midnight for PPTL. Does not want any metal clamps so plan Pomeroy's method.   Lluvia Gwynne R 06/14/2014, 10:54 PM

## 2013-11-17 ENCOUNTER — Other Ambulatory Visit: Payer: Self-pay

## 2013-11-17 LAB — OB RESULTS CONSOLE ANTIBODY SCREEN: ANTIBODY SCREEN: NEGATIVE

## 2013-11-17 LAB — OB RESULTS CONSOLE HIV ANTIBODY (ROUTINE TESTING): HIV: NONREACTIVE

## 2013-11-17 LAB — OB RESULTS CONSOLE GC/CHLAMYDIA
CHLAMYDIA, DNA PROBE: NEGATIVE
Gonorrhea: NEGATIVE

## 2013-11-17 LAB — OB RESULTS CONSOLE RPR: RPR: NONREACTIVE

## 2013-11-17 LAB — OB RESULTS CONSOLE HEPATITIS B SURFACE ANTIGEN: HEP B S AG: NEGATIVE

## 2013-11-17 LAB — OB RESULTS CONSOLE ABO/RH: RH Type: POSITIVE

## 2013-11-17 LAB — OB RESULTS CONSOLE RUBELLA ANTIBODY, IGM: RUBELLA: IMMUNE

## 2014-05-16 LAB — OB RESULTS CONSOLE GBS: GBS: NEGATIVE

## 2014-05-25 ENCOUNTER — Other Ambulatory Visit (HOSPITAL_COMMUNITY): Payer: Self-pay | Admitting: Obstetrics & Gynecology

## 2014-05-25 ENCOUNTER — Ambulatory Visit (HOSPITAL_COMMUNITY)
Admission: RE | Admit: 2014-05-25 | Discharge: 2014-05-25 | Disposition: A | Discharge status: Home / Self Care | Payer: Managed Care, Other (non HMO) | Source: Ambulatory Visit | Attending: Obstetrics & Gynecology | Admitting: Obstetrics & Gynecology

## 2014-05-25 ENCOUNTER — Encounter (HOSPITAL_COMMUNITY): Payer: Self-pay

## 2014-05-25 VITALS — BP 119/67 | HR 93 | Wt 199.0 lb

## 2014-05-25 DIAGNOSIS — O4393 Unspecified placental disorder, third trimester: Secondary | ICD-10-CM

## 2014-05-25 DIAGNOSIS — Z0372 Encounter for suspected placental problem ruled out: Secondary | ICD-10-CM

## 2014-05-25 DIAGNOSIS — O09299 Supervision of pregnancy with other poor reproductive or obstetric history, unspecified trimester: Secondary | ICD-10-CM | POA: Diagnosis not present

## 2014-05-25 DIAGNOSIS — O43899 Other placental disorders, unspecified trimester: Secondary | ICD-10-CM | POA: Diagnosis present

## 2014-06-05 ENCOUNTER — Other Ambulatory Visit: Payer: Self-pay | Admitting: Obstetrics & Gynecology

## 2014-06-12 ENCOUNTER — Telehealth (HOSPITAL_COMMUNITY): Payer: Self-pay | Admitting: *Deleted

## 2014-06-12 NOTE — Telephone Encounter (Signed)
Preadmission screen  

## 2014-06-14 ENCOUNTER — Encounter (HOSPITAL_COMMUNITY): Payer: Managed Care, Other (non HMO) | Admitting: Anesthesiology

## 2014-06-14 ENCOUNTER — Inpatient Hospital Stay (HOSPITAL_COMMUNITY): Payer: Managed Care, Other (non HMO) | Admitting: Anesthesiology

## 2014-06-14 ENCOUNTER — Encounter (HOSPITAL_COMMUNITY): Payer: Self-pay

## 2014-06-14 ENCOUNTER — Inpatient Hospital Stay (HOSPITAL_COMMUNITY)
Admission: RE | Admit: 2014-06-14 | Discharge: 2014-06-16 | DRG: 767 | Disposition: A | Discharge status: Home / Self Care | Payer: Managed Care, Other (non HMO) | Source: Ambulatory Visit | Attending: Obstetrics & Gynecology | Admitting: Obstetrics & Gynecology

## 2014-06-14 DIAGNOSIS — O0943 Supervision of pregnancy with grand multiparity, third trimester: Secondary | ICD-10-CM | POA: Diagnosis not present

## 2014-06-14 DIAGNOSIS — Z3A39 39 weeks gestation of pregnancy: Secondary | ICD-10-CM | POA: Diagnosis present

## 2014-06-14 DIAGNOSIS — Z302 Encounter for sterilization: Secondary | ICD-10-CM

## 2014-06-14 DIAGNOSIS — E669 Obesity, unspecified: Secondary | ICD-10-CM | POA: Diagnosis present

## 2014-06-14 DIAGNOSIS — Z6830 Body mass index (BMI) 30.0-30.9, adult: Secondary | ICD-10-CM

## 2014-06-14 DIAGNOSIS — Z3490 Encounter for supervision of normal pregnancy, unspecified, unspecified trimester: Secondary | ICD-10-CM

## 2014-06-14 DIAGNOSIS — Z3483 Encounter for supervision of other normal pregnancy, third trimester: Secondary | ICD-10-CM | POA: Diagnosis present

## 2014-06-14 DIAGNOSIS — O99214 Obesity complicating childbirth: Secondary | ICD-10-CM | POA: Diagnosis present

## 2014-06-14 DIAGNOSIS — Z8759 Personal history of other complications of pregnancy, childbirth and the puerperium: Secondary | ICD-10-CM

## 2014-06-14 DIAGNOSIS — K219 Gastro-esophageal reflux disease without esophagitis: Secondary | ICD-10-CM | POA: Diagnosis present

## 2014-06-14 DIAGNOSIS — O99613 Diseases of the digestive system complicating pregnancy, third trimester: Principal | ICD-10-CM | POA: Diagnosis present

## 2014-06-14 DIAGNOSIS — Z833 Family history of diabetes mellitus: Secondary | ICD-10-CM

## 2014-06-14 DIAGNOSIS — Z8249 Family history of ischemic heart disease and other diseases of the circulatory system: Secondary | ICD-10-CM | POA: Diagnosis not present

## 2014-06-14 DIAGNOSIS — Z9851 Tubal ligation status: Secondary | ICD-10-CM

## 2014-06-14 HISTORY — DX: Tubal ligation status: Z98.51

## 2014-06-14 LAB — CBC
HEMATOCRIT: 38.3 % (ref 36.0–46.0)
HEMATOCRIT: 40.3 % (ref 36.0–46.0)
Hemoglobin: 13.5 g/dL (ref 12.0–15.0)
Hemoglobin: 14.2 g/dL (ref 12.0–15.0)
MCH: 31.5 pg (ref 26.0–34.0)
MCH: 31.6 pg (ref 26.0–34.0)
MCHC: 35.2 g/dL (ref 30.0–36.0)
MCHC: 35.2 g/dL (ref 30.0–36.0)
MCV: 89.5 fL (ref 78.0–100.0)
MCV: 89.6 fL (ref 78.0–100.0)
Platelets: 174 10*3/uL (ref 150–400)
Platelets: 171 10*3/uL (ref 150–400)
RBC: 4.28 MIL/uL (ref 3.87–5.11)
RBC: 4.5 MIL/uL (ref 3.87–5.11)
RDW: 13.4 % (ref 11.5–15.5)
RDW: 13.3 % (ref 11.5–15.5)
WBC: 10.6 10*3/uL — ABNORMAL HIGH (ref 4.0–10.5)
WBC: 17.5 10*3/uL — ABNORMAL HIGH (ref 4.0–10.5)

## 2014-06-14 LAB — TYPE AND SCREEN
ABO/RH(D): O POS
Antibody Screen: NEGATIVE

## 2014-06-14 LAB — ABO/RH: ABO/RH(D): O POS

## 2014-06-14 LAB — RPR

## 2014-06-14 MED ORDER — FLEET ENEMA 7-19 GM/118ML RE ENEM: 1.0000 | ENEMA | RECTAL | Status: DC | PRN

## 2014-06-14 MED ORDER — ONDANSETRON HCL 4 MG/2ML IJ SOLN
4.0000 mg | Freq: Four times a day (QID) | INTRAMUSCULAR | Status: DC | PRN
Start: 2014-06-14 — End: 2014-06-15

## 2014-06-14 MED ORDER — LACTATED RINGERS IV SOLN: 500.0000 mL | INTRAVENOUS | Status: DC | PRN

## 2014-06-14 MED ORDER — MISOPROSTOL 200 MCG PO TABS
ORAL_TABLET | ORAL | Status: AC
  Administered 2014-06-14: 1000 ug via RECTAL
  Filled 2014-06-14: qty 5

## 2014-06-14 MED ORDER — FENTANYL 2.5 MCG/ML BUPIVACAINE 1/10 % EPIDURAL INFUSION (WH - ANES)
14.0000 mL/h | INTRAMUSCULAR | Status: DC | PRN
  Administered 2014-06-14: 14 mL/h via EPIDURAL

## 2014-06-14 MED ORDER — PHENYLEPHRINE 40 MCG/ML (10ML) SYRINGE FOR IV PUSH (FOR BLOOD PRESSURE SUPPORT)
80.0000 ug | PREFILLED_SYRINGE | INTRAVENOUS | Status: DC | PRN
  Filled 2014-06-14: qty 2

## 2014-06-14 MED ORDER — PHENYLEPHRINE 40 MCG/ML (10ML) SYRINGE FOR IV PUSH (FOR BLOOD PRESSURE SUPPORT)
80.0000 ug | PREFILLED_SYRINGE | INTRAVENOUS | Status: DC | PRN
  Administered 2014-06-14: 80 ug via INTRAVENOUS
  Filled 2014-06-14: qty 2

## 2014-06-14 MED ORDER — MISOPROSTOL 200 MCG PO TABS
1000.0000 ug | ORAL_TABLET | Freq: Once | ORAL | Status: AC
  Administered 2014-06-14: 1000 ug via RECTAL

## 2014-06-14 MED ORDER — FENTANYL 2.5 MCG/ML BUPIVACAINE 1/10 % EPIDURAL INFUSION (WH - ANES)
INTRAMUSCULAR | Status: DC | PRN
  Administered 2014-06-14: 14 mL/h via EPIDURAL

## 2014-06-14 MED ORDER — DIPHENHYDRAMINE HCL 50 MG/ML IJ SOLN: 12.5000 mg | INTRAMUSCULAR | Status: DC | PRN

## 2014-06-14 MED ORDER — OXYTOCIN 40 UNITS IN LACTATED RINGERS INFUSION - SIMPLE MED: 62.5000 mL/h | INTRAVENOUS | Status: DC

## 2014-06-14 MED ORDER — OXYCODONE-ACETAMINOPHEN 5-325 MG PO TABS: 1.0000 | ORAL_TABLET | ORAL | Status: DC | PRN

## 2014-06-14 MED ORDER — OXYCODONE-ACETAMINOPHEN 5-325 MG PO TABS: 2.0000 | ORAL_TABLET | ORAL | Status: DC | PRN

## 2014-06-14 MED ORDER — LACTATED RINGERS IV SOLN
INTRAVENOUS | Status: DC
  Administered 2014-06-14: 1000 mL via INTRAVENOUS
  Administered 2014-06-14: 20:00:00 via INTRAVENOUS
  Administered 2014-06-14: 1000 mL via INTRAVENOUS

## 2014-06-14 MED ORDER — LIDOCAINE HCL (PF) 1 % IJ SOLN
30.0000 mL | INTRAMUSCULAR | Status: DC | PRN
  Filled 2014-06-14: qty 30

## 2014-06-14 MED ORDER — PROMETHAZINE HCL 25 MG/ML IJ SOLN
25.0000 mg | Freq: Four times a day (QID) | INTRAMUSCULAR | Status: DC | PRN
  Administered 2014-06-14: 25 mg via INTRAVENOUS
  Filled 2014-06-14: qty 1

## 2014-06-14 MED ORDER — EPHEDRINE 5 MG/ML INJ
10.0000 mg | INTRAVENOUS | Status: DC | PRN
  Filled 2014-06-14: qty 2

## 2014-06-14 MED ORDER — LACTATED RINGERS IV SOLN
500.0000 mL | Freq: Once | INTRAVENOUS | Status: AC
  Administered 2014-06-14: 500 mL via INTRAVENOUS

## 2014-06-14 MED ORDER — LIDOCAINE HCL (PF) 1 % IJ SOLN
INTRAMUSCULAR | Status: DC | PRN
  Administered 2014-06-14 (×2): 4 mL

## 2014-06-14 MED ORDER — FENTANYL 2.5 MCG/ML BUPIVACAINE 1/10 % EPIDURAL INFUSION (WH - ANES)
INTRAMUSCULAR | Status: AC
  Administered 2014-06-14: 14 mL/h via EPIDURAL
  Filled 2014-06-14: qty 125

## 2014-06-14 MED ORDER — CITRIC ACID-SODIUM CITRATE 334-500 MG/5ML PO SOLN
30.0000 mL | ORAL | Status: DC | PRN
  Administered 2014-06-14: 30 mL via ORAL
  Filled 2014-06-14: qty 15

## 2014-06-14 MED ORDER — OXYTOCIN 40 UNITS IN LACTATED RINGERS INFUSION - SIMPLE MED
250.0000 mL/h | INTRAVENOUS | Status: DC
  Administered 2014-06-14: 250 mL/h via INTRAVENOUS

## 2014-06-14 MED ORDER — FENTANYL 2.5 MCG/ML BUPIVACAINE 1/10 % EPIDURAL INFUSION (WH - ANES): INTRAMUSCULAR | Status: DC | PRN

## 2014-06-14 MED ORDER — OXYTOCIN BOLUS FROM INFUSION
500.0000 mL | INTRAVENOUS | Status: DC
  Administered 2014-06-14: 500 mL via INTRAVENOUS

## 2014-06-14 MED ORDER — OXYTOCIN 40 UNITS IN LACTATED RINGERS INFUSION - SIMPLE MED
1.0000 m[IU]/min | INTRAVENOUS | Status: DC
  Administered 2014-06-14: 4 m[IU]/min via INTRAVENOUS
  Administered 2014-06-14: 8 m[IU]/min via INTRAVENOUS
  Administered 2014-06-14: 2 m[IU]/min via INTRAVENOUS
  Administered 2014-06-14: 10 m[IU]/min via INTRAVENOUS
  Administered 2014-06-14: 12 m[IU]/min via INTRAVENOUS
  Filled 2014-06-14: qty 1000

## 2014-06-14 MED ORDER — METHYLERGONOVINE MALEATE 0.2 MG/ML IJ SOLN
0.2000 mg | Freq: Once | INTRAMUSCULAR | Status: AC
  Administered 2014-06-14: 0.2 mg via INTRAMUSCULAR

## 2014-06-14 MED ORDER — METHYLERGONOVINE MALEATE 0.2 MG/ML IJ SOLN
INTRAMUSCULAR | Status: AC
  Filled 2014-06-14: qty 1

## 2014-06-14 MED ORDER — BUTORPHANOL TARTRATE 1 MG/ML IJ SOLN
1.0000 mg | Freq: Once | INTRAMUSCULAR | Status: AC
  Administered 2014-06-14: 1 mg via INTRAVENOUS
  Filled 2014-06-14: qty 1

## 2014-06-14 MED ORDER — PHENYLEPHRINE 40 MCG/ML (10ML) SYRINGE FOR IV PUSH (FOR BLOOD PRESSURE SUPPORT)
PREFILLED_SYRINGE | INTRAVENOUS | Status: AC
  Administered 2014-06-14: 80 ug via INTRAVENOUS
  Filled 2014-06-14: qty 10

## 2014-06-14 MED ORDER — TERBUTALINE SULFATE 1 MG/ML IJ SOLN: 0.2500 mg | Freq: Once | INTRAMUSCULAR | Status: AC | PRN

## 2014-06-14 MED ORDER — ACETAMINOPHEN 325 MG PO TABS
650.0000 mg | ORAL_TABLET | ORAL | Status: DC | PRN
  Administered 2014-06-14: 650 mg via ORAL
  Filled 2014-06-14: qty 2

## 2014-06-14 NOTE — H&P (Signed)
[name] is a 28 y.o. female presenting labor IOL for h/o stillbirth.  [id number] (3 girls, one boy was stillborn- 3rd child). ANtesting weekly from 32 wks and 2x/wk since 36 wks. Incidental finding of a large preplacental cyst/ possible lake, saw MFM at 34 wks.  Good interval growth at 28 and 36 wks, last sono EFW 6'13" at 79% at 36 wks.  Denies pain/bleeding.  [facility], Dr Juliene PinaMody primary.   History OB History   Grav Para Term Preterm Abortions TAB SAB Ect Mult Living   6 4 4  0 1 0 1 0 0 3     Past Medical History  Diagnosis Date  . Asthma 2011    pregnancy induced  . Depression 2011  . Incomplete miscarriage 03/24/2011  . Hx of intrauterine growth retardation and stillbirth, currently pregnant   . UTI (lower urinary tract infection)   . Normal labor 06/16/2012  . Postpartum care and examination immediately after delivery (06/16/12) 06/17/2012   Past Surgical History  Procedure Laterality Date  . No past surgeries    . Dilation and evacuation  03/24/2011    Procedure: DILATATION AND EVACUATION (D&E);  Surgeon: Janine LimboArthur V Stringer, MD;  Location: [facility];  Service: Gynecology;  Laterality: N/A;   Family History: family history includes Diabetes in her maternal grandfather; Dwarfism in her sister; Heart attack in her paternal grandfather. Social History:  reports that she has never smoked. She does not have any smokeless tobacco history on file. She reports that she drinks alcohol. She reports that she does not use illicit drugs.   Prenatal Transfer Tool  Maternal Diabetes: No Genetic Screening: Normal--XX, nl Informaseq and AFP Maternal Ultrasounds/Referrals: Normal, normal serial growth, ANtesting reactive Fetal Ultrasounds or other Referrals:  MFM for pre-placental lake at 34 wks, stable, no intervention Maternal Substance Abuse:  No Significant Maternal Medications:  None Significant Maternal Lab Results:  Lab values include: Group B Strep negative Other Comments:   None  ROS  Neg   Blood pressure 116/74, pulse 90, temperature 98.5 F (36.9 C), temperature source Oral, resp. rate 18, height 5\' 8"  (1.727 m), weight 203 lb (92.08 kg), unknown if currently breastfeeding. Exam Physical Exam  A&O x 3, no acute distress. Pleasant HEENT neg, no thyromegaly Lungs CTA bilat CV RRR, S1S2 normal Abdo soft, non tender, non acute Extr no edema/ tenderness Pelvic 2.5 cm/ 70%/ floating/ VTX, controlled AROM, clear copious fluid. Head came down at -4 and well applied to cx.  FHT  130/ cat-I Toco regular q 3 min with UCs.   Prenatal labs:  ABO, Rh: --/--/O POS, O POS (10/08 0747) Antibody: NEG (10/08 0747) Rubella: Immune (03/13 0000) RPR: NON REAC (10/08 0747)  HBsAg: Negative (03/13 0000)  HIV: Non-reactive (03/13 0000)  GBS: Negative (09/09 0000)  Glucola nl Informaseq nl and XX, AFP1 neg. Toxo neg  Assessment/Plan: [redacted] wks gestations IOL for prior stillbirth hx. EFW 7.1/2-8 lbs. Pitocin, epidural in active labor. Anticipate SVD. FHT category I  Desires permanent sterilization with tubal ligation  [name] 06/14/2014, 2:38 PM

## 2014-06-14 NOTE — Anesthesia Procedure Notes (Signed)
Epidural Patient location during procedure: OB Start time: 06/14/2014 8:07 PM  Staffing Anesthesiologist: Dorcus Riga A.  Preanesthetic Checklist Completed: patient identified, site marked, surgical consent, pre-op evaluation, timeout performed, IV checked, risks and benefits discussed and monitors and equipment checked  Epidural Patient position: sitting Prep: site prepped and draped and DuraPrep Patient monitoring: continuous pulse ox and blood pressure Approach: midline Injection technique: LOR air  Needle:  Needle type: Tuohy  Needle gauge: 17 G Needle length: 9 cm and 9 Needle insertion depth: 4 cm Catheter type: closed end flexible Catheter size: 19 Gauge Catheter at skin depth: 9 cm Test dose: negative  Assessment Events: blood not aspirated, injection not painful, no injection resistance, negative IV test and no paresthesia  Additional Notes Patient identified. Risks and benefits discussed including failed block, incomplete  Pain control, post dural puncture headache, nerve damage, paralysis, blood pressure Changes, nausea, vomiting, reactions to medications-both toxic and allergic and post Partum back pain. All questions were answered. Patient expressed understanding and wished to proceed. Sterile technique was used throughout procedure. Epidural site was Dressed with sterile barrier dressing. No paresthesias, signs of intravascular injection Or signs of intrathecal spread were encountered.  Patient was more comfortable after the epidural was dosed. Please see RN's note for documentation of vital signs and FHR which are stable.

## 2014-06-14 NOTE — Anesthesia Preprocedure Evaluation (Addendum)
Anesthesia Evaluation  Patient identified by MRN, date of birth, ID band Patient awake    Reviewed: Allergy & Precautions, H&P , Patient's Chart, lab work & pertinent test results  Airway Mallampati: II TM Distance: >3 FB Neck ROM: Full    Dental no notable dental hx. (+) Teeth Intact   Pulmonary asthma ,  breath sounds clear to auscultation  Pulmonary exam normal       Cardiovascular negative cardio ROS  Rhythm:Regular Rate:Normal     Neuro/Psych PSYCHIATRIC DISORDERS Depression negative neurological ROS     GI/Hepatic Neg liver ROS, GERD-  ,  Endo/Other  Obesity  Renal/GU negative Renal ROS  negative genitourinary   Musculoskeletal negative musculoskeletal ROS (+)   Abdominal (+) + obese,   Peds  Hematology negative hematology ROS (+)   Anesthesia Other Findings   Reproductive/Obstetrics (+) Pregnancy                          Anesthesia Physical Anesthesia Plan  ASA: II  Anesthesia Plan: Epidural   Post-op Pain Management:    Induction:   Airway Management Planned: Natural Airway  Additional Equipment:   Intra-op Plan:   Post-operative Plan:   Informed Consent: I have reviewed the patients History and Physical, chart, labs and discussed the procedure including the risks, benefits and alternatives for the proposed anesthesia with the patient or authorized representative who has indicated his/her understanding and acceptance.     Plan Discussed with: Anesthesiologist  Anesthesia Plan Comments:         Anesthesia Quick Evaluation

## 2014-06-15 ENCOUNTER — Encounter (HOSPITAL_COMMUNITY)
Admission: RE | Disposition: A | Discharge status: Home / Self Care | Payer: Self-pay | Source: Ambulatory Visit | Attending: Obstetrics & Gynecology

## 2014-06-15 ENCOUNTER — Encounter (HOSPITAL_COMMUNITY): Payer: Managed Care, Other (non HMO)

## 2014-06-15 ENCOUNTER — Inpatient Hospital Stay (HOSPITAL_COMMUNITY): Payer: Managed Care, Other (non HMO)

## 2014-06-15 ENCOUNTER — Encounter (HOSPITAL_COMMUNITY): Payer: Self-pay

## 2014-06-15 DIAGNOSIS — Z9851 Tubal ligation status: Secondary | ICD-10-CM

## 2014-06-15 HISTORY — PX: TUBAL LIGATION: SHX77

## 2014-06-15 HISTORY — DX: Tubal ligation status: Z98.51

## 2014-06-15 LAB — CBC
HEMATOCRIT: 38.1 % (ref 36.0–46.0)
HEMOGLOBIN: 13.6 g/dL (ref 12.0–15.0)
MCH: 31.9 pg (ref 26.0–34.0)
MCHC: 35.7 g/dL (ref 30.0–36.0)
MCV: 89.4 fL (ref 78.0–100.0)
Platelets: 173 10*3/uL (ref 150–400)
RBC: 4.26 MIL/uL (ref 3.87–5.11)
RDW: 13.2 % (ref 11.5–15.5)
WBC: 14.3 10*3/uL — ABNORMAL HIGH (ref 4.0–10.5)

## 2014-06-15 LAB — SURGICAL PCR SCREEN
MRSA, PCR: NEGATIVE
STAPHYLOCOCCUS AUREUS: NEGATIVE

## 2014-06-15 SURGERY — LIGATION, FALLOPIAN TUBE, POSTPARTUM
Anesthesia: Epidural | Site: Abdomen

## 2014-06-15 MED ORDER — SENNOSIDES-DOCUSATE SODIUM 8.6-50 MG PO TABS
2.0000 | ORAL_TABLET | ORAL | Status: DC
  Administered 2014-06-16: 2 via ORAL
  Filled 2014-06-15: qty 2

## 2014-06-15 MED ORDER — IBUPROFEN 600 MG PO TABS
600.0000 mg | ORAL_TABLET | Freq: Four times a day (QID) | ORAL | Status: DC
  Administered 2014-06-15 – 2014-06-16 (×5): 600 mg via ORAL
  Filled 2014-06-15 (×5): qty 1

## 2014-06-15 MED ORDER — METOCLOPRAMIDE HCL 10 MG PO TABS
10.0000 mg | ORAL_TABLET | Freq: Once | ORAL | Status: AC
  Administered 2014-06-15: 10 mg via ORAL
  Filled 2014-06-15: qty 1

## 2014-06-15 MED ORDER — KETOROLAC TROMETHAMINE 30 MG/ML IJ SOLN
INTRAMUSCULAR | Status: AC
  Administered 2014-06-15: 30 mg via INTRAVENOUS
  Filled 2014-06-15: qty 1

## 2014-06-15 MED ORDER — MIDAZOLAM HCL 2 MG/2ML IJ SOLN: 0.5000 mg | Freq: Once | INTRAMUSCULAR | Status: AC | PRN

## 2014-06-15 MED ORDER — OXYCODONE-ACETAMINOPHEN 5-325 MG PO TABS
1.0000 | ORAL_TABLET | ORAL | Status: DC | PRN
  Administered 2014-06-15: 1 via ORAL
  Filled 2014-06-15 (×2): qty 1

## 2014-06-15 MED ORDER — PRENATAL MULTIVITAMIN CH
1.0000 | ORAL_TABLET | Freq: Every day | ORAL | Status: DC
  Administered 2014-06-16: 1 via ORAL
  Filled 2014-06-15: qty 1

## 2014-06-15 MED ORDER — BENZOCAINE-MENTHOL 20-0.5 % EX AERO
1.0000 | INHALATION_SPRAY | CUTANEOUS | Status: DC | PRN
Start: 2014-06-15 — End: 2014-06-16

## 2014-06-15 MED ORDER — FAMOTIDINE 20 MG PO TABS
40.0000 mg | ORAL_TABLET | Freq: Once | ORAL | Status: AC
  Administered 2014-06-15: 40 mg via ORAL
  Filled 2014-06-15: qty 2

## 2014-06-15 MED ORDER — ZOLPIDEM TARTRATE 5 MG PO TABS: 5.0000 mg | ORAL_TABLET | Freq: Every evening | ORAL | Status: DC | PRN

## 2014-06-15 MED ORDER — LANOLIN HYDROUS EX OINT: TOPICAL_OINTMENT | CUTANEOUS | Status: DC | PRN

## 2014-06-15 MED ORDER — PHENYLEPHRINE HCL 10 MG/ML IJ SOLN
INTRAMUSCULAR | Status: DC | PRN
  Administered 2014-06-15: 40 ug via INTRAVENOUS

## 2014-06-15 MED ORDER — ONDANSETRON HCL 4 MG/2ML IJ SOLN: 4.0000 mg | INTRAMUSCULAR | Status: DC | PRN

## 2014-06-15 MED ORDER — SIMETHICONE 80 MG PO CHEW: 80.0000 mg | CHEWABLE_TABLET | ORAL | Status: DC | PRN

## 2014-06-15 MED ORDER — SODIUM BICARBONATE 8.4 % IV SOLN
INTRAVENOUS | Status: DC | PRN
  Administered 2014-06-15: 5 mL via EPIDURAL
  Administered 2014-06-15: 7 mL via EPIDURAL
  Administered 2014-06-15: 5 mL via EPIDURAL
  Administered 2014-06-15: 3 mL via EPIDURAL

## 2014-06-15 MED ORDER — FAMOTIDINE 20 MG PO TABS
20.0000 mg | ORAL_TABLET | Freq: Every day | ORAL | Status: AC
  Filled 2014-06-15: qty 1

## 2014-06-15 MED ORDER — METHYLERGONOVINE MALEATE 0.2 MG PO TABS: 0.2000 mg | ORAL_TABLET | ORAL | Status: DC | PRN

## 2014-06-15 MED ORDER — KETOROLAC TROMETHAMINE 30 MG/ML IJ SOLN
15.0000 mg | Freq: Once | INTRAMUSCULAR | Status: AC | PRN
  Administered 2014-06-15: 30 mg via INTRAVENOUS

## 2014-06-15 MED ORDER — TETANUS-DIPHTH-ACELL PERTUSSIS 5-2.5-18.5 LF-MCG/0.5 IM SUSP: 0.5000 mL | Freq: Once | INTRAMUSCULAR | Status: DC

## 2014-06-15 MED ORDER — FENTANYL CITRATE 0.05 MG/ML IJ SOLN
INTRAMUSCULAR | Status: DC | PRN
  Administered 2014-06-15: 50 ug via INTRAVENOUS
  Administered 2014-06-15 (×2): 25 ug via INTRAVENOUS

## 2014-06-15 MED ORDER — DIBUCAINE 1 % RE OINT: 1.0000 "application " | TOPICAL_OINTMENT | RECTAL | Status: DC | PRN

## 2014-06-15 MED ORDER — FENTANYL CITRATE 0.05 MG/ML IJ SOLN: 25.0000 ug | INTRAMUSCULAR | Status: DC | PRN

## 2014-06-15 MED ORDER — MEPERIDINE HCL 25 MG/ML IJ SOLN: 6.2500 mg | INTRAMUSCULAR | Status: DC | PRN

## 2014-06-15 MED ORDER — DIPHENHYDRAMINE HCL 25 MG PO CAPS: 25.0000 mg | ORAL_CAPSULE | Freq: Four times a day (QID) | ORAL | Status: DC | PRN

## 2014-06-15 MED ORDER — MIDAZOLAM HCL 5 MG/5ML IJ SOLN
INTRAMUSCULAR | Status: DC | PRN
  Administered 2014-06-15 (×3): 0.5 mg via INTRAVENOUS
  Administered 2014-06-15: 1 mg via INTRAVENOUS
  Administered 2014-06-15: 0.5 mg via INTRAVENOUS

## 2014-06-15 MED ORDER — EPHEDRINE SULFATE 50 MG/ML IJ SOLN
INTRAMUSCULAR | Status: DC | PRN
  Administered 2014-06-15: 10 mg via INTRAVENOUS

## 2014-06-15 MED ORDER — ONDANSETRON HCL 4 MG PO TABS: 4.0000 mg | ORAL_TABLET | ORAL | Status: DC | PRN

## 2014-06-15 MED ORDER — WITCH HAZEL-GLYCERIN EX PADS: 1.0000 "application " | MEDICATED_PAD | CUTANEOUS | Status: DC | PRN

## 2014-06-15 MED ORDER — BUPIVACAINE HCL (PF) 0.25 % IJ SOLN
INTRAMUSCULAR | Status: AC
  Filled 2014-06-15: qty 30

## 2014-06-15 MED ORDER — 0.9 % SODIUM CHLORIDE (POUR BTL) OPTIME
TOPICAL | Status: DC | PRN
  Administered 2014-06-15: 1000 mL

## 2014-06-15 MED ORDER — METHYLERGONOVINE MALEATE 0.2 MG/ML IJ SOLN: 0.2000 mg | INTRAMUSCULAR | Status: DC | PRN

## 2014-06-15 MED ORDER — PROMETHAZINE HCL 25 MG/ML IJ SOLN: 6.2500 mg | INTRAMUSCULAR | Status: DC | PRN

## 2014-06-15 MED ORDER — LACTATED RINGERS IV SOLN
INTRAVENOUS | Status: DC
  Administered 2014-06-15: 10:00:00 via INTRAVENOUS

## 2014-06-15 MED ORDER — LACTATED RINGERS IV SOLN
INTRAVENOUS | Status: DC | PRN
  Administered 2014-06-15 (×2): via INTRAVENOUS

## 2014-06-15 SURGICAL SUPPLY — 27 items
CHLORAPREP W/TINT 26ML (MISCELLANEOUS) ×3 IMPLANT
CLOSURE WOUND 1/4 X3 (GAUZE/BANDAGES/DRESSINGS) ×1
CLOTH BEACON ORANGE TIMEOUT ST (SAFETY) ×3 IMPLANT
CONTAINER PREFILL 10% NBF 15ML (MISCELLANEOUS) ×6 IMPLANT
DERMABOND ADVANCED (GAUZE/BANDAGES/DRESSINGS) ×2
DERMABOND ADVANCED .7 DNX12 (GAUZE/BANDAGES/DRESSINGS) ×1 IMPLANT
DRSG OPSITE POSTOP 3X4 (GAUZE/BANDAGES/DRESSINGS) ×3 IMPLANT
ELECT REM PT RETURN 9FT ADLT (ELECTROSURGICAL) ×3
ELECTRODE REM PT RTRN 9FT ADLT (ELECTROSURGICAL) ×1 IMPLANT
GLOVE BIO SURGEON STRL SZ7 (GLOVE) ×3 IMPLANT
GLOVE BIOGEL PI IND STRL 7.0 (GLOVE) ×1 IMPLANT
GLOVE BIOGEL PI INDICATOR 7.0 (GLOVE) ×2
GOWN STRL REUS W/TWL LRG LVL3 (GOWN DISPOSABLE) ×6 IMPLANT
NEEDLE HYPO 25X1 1.5 SAFETY (NEEDLE) ×3 IMPLANT
NS IRRIG 1000ML POUR BTL (IV SOLUTION) ×3 IMPLANT
PACK ABDOMINAL MINOR (CUSTOM PROCEDURE TRAY) ×3 IMPLANT
PENCIL BUTTON HOLSTER BLD 10FT (ELECTRODE) ×3 IMPLANT
SPONGE LAP 4X18 X RAY DECT (DISPOSABLE) IMPLANT
STRIP CLOSURE SKIN 1/4X3 (GAUZE/BANDAGES/DRESSINGS) ×2 IMPLANT
SUT PLAIN 0 NONE (SUTURE) ×3 IMPLANT
SUT VIC AB 0 CT1 27 (SUTURE) ×2
SUT VIC AB 0 CT1 27XBRD ANBCTR (SUTURE) ×1 IMPLANT
SUT VICRYL 4-0 PS2 18IN ABS (SUTURE) ×3 IMPLANT
SYR CONTROL 10ML LL (SYRINGE) ×3 IMPLANT
TOWEL OR 17X24 6PK STRL BLUE (TOWEL DISPOSABLE) ×6 IMPLANT
TRAY FOLEY CATH 14FR (SET/KITS/TRAYS/PACK) ×3 IMPLANT
WATER STERILE IRR 1000ML POUR (IV SOLUTION) ×3 IMPLANT

## 2014-06-15 NOTE — Anesthesia Postprocedure Evaluation (Signed)
  Anesthesia Post-op Note  Patient: [name]  Procedure(s) Performed: * No procedures listed *  Patient Location: Mother/Baby  Anesthesia Type:Epidural  Level of Consciousness: awake  Airway and Oxygen Therapy: Patient Spontanous Breathing  Post-op Pain: none  Post-op Assessment: Post-op Vital signs reviewed, Patient's Cardiovascular Status Stable, Respiratory Function Stable, Patent Airway, No signs of Nausea or vomiting, Adequate PO intake, Pain level controlled, No headache, No backache, No residual numbness and No residual motor weakness  Post-op Vital Signs: Reviewed and stable  Last Vitals:  Filed Vitals:   06/15/14 0500  BP: 109/73  Pulse: 99  Temp: 37.2 C  Resp: 18    Complications: No apparent anesthesia complications

## 2014-06-15 NOTE — Anesthesia Preprocedure Evaluation (Signed)
Anesthesia Evaluation  Patient identified by MRN, date of birth, ID band Patient awake    Reviewed: Allergy & Precautions, H&P , Patient's Chart, lab work & pertinent test results  Airway Mallampati: II TM Distance: >3 FB Neck ROM: Full    Dental no notable dental hx. (+) Teeth Intact   Pulmonary asthma ,  breath sounds clear to auscultation  Pulmonary exam normal       Cardiovascular negative cardio ROS  Rhythm:Regular Rate:Normal     Neuro/Psych PSYCHIATRIC DISORDERS Depression negative neurological ROS     GI/Hepatic Neg liver ROS, GERD-  ,  Endo/Other  Obesity  Renal/GU negative Renal ROS  negative genitourinary   Musculoskeletal negative musculoskeletal ROS (+)   Abdominal (+) + obese,   Peds  Hematology negative hematology ROS (+)   Anesthesia Other Findings   Reproductive/Obstetrics                           Anesthesia Physical  Anesthesia Plan  ASA: II  Anesthesia Plan: Epidural   Post-op Pain Management:    Induction:   Airway Management Planned: Natural Airway  Additional Equipment:   Intra-op Plan:   Post-operative Plan:   Informed Consent: I have reviewed the patients History and Physical, chart, labs and discussed the procedure including the risks, benefits and alternatives for the proposed anesthesia with the patient or authorized representative who has indicated his/her understanding and acceptance.     Plan Discussed with: Anesthesiologist  Anesthesia Plan Comments:         Anesthesia Quick Evaluation

## 2014-06-15 NOTE — Transfer of Care (Signed)
Immediate Anesthesia Transfer of Care Note  Patient: [name]  Procedure(s) Performed: Procedure(s): POST PARTUM TUBAL LIGATION (N/A)  Patient Location: PACU  Anesthesia Type:Epidural  Level of Consciousness: awake, alert  and oriented  Airway & Oxygen Therapy: Patient Spontanous Breathing  Post-op Assessment: Report given to PACU RN and Post -op Vital signs reviewed and stable  Post vital signs: Reviewed and stable  Complications: No apparent anesthesia complications

## 2014-06-15 NOTE — Progress Notes (Signed)
Patient ID: [name], female   DOB: [birth year], 28 y.o.   MRN: [id number] Patient not in room at time of rounds / in OR for PPTL  Lori GowerDAWSON, [name], M MSN, CNM 06/15/2014 10:23 AM

## 2014-06-15 NOTE — Addendum Note (Signed)
Addendum created 06/15/14 1929 by Shanon PayorSuzanne M Loxley Cibrian, CRNA   Modules edited: Notes Section   Notes Section:  File: [id number]

## 2014-06-15 NOTE — Op Note (Signed)
Preoperative diagnosis: Multiparity, permanent sterilization desired Postoperative diagnosis: Same Procedure: Bilateral tubal sterilization by Modified Pomeroy's technique Surgeon: Dr Shea EvansVaishali Omaria Plunk, MD Assistants: none Anesthesia: Epidural  IV fluids:  LR EBL: minimal Urine: foley 10 cc Complications: none Disposition: PACU  Specimens: cut segments of both fallopian tubes   Procedure Patient is 28 yo G6P4, who desired permanent sterilization via tubal ligation. All options of contraception and sterilization were reviewed including IUDs, Essure, as well as vasectomy. Patient declined other options. Risk and complications of surgery including infection, bleeding, damage to internal organs, other complications including pneumonia, VTE were reviewed. Also discussed irreversibility as well as failure and risk of ectopic pregnancy. Patient voiced understanding. Informed written consent was obtained. Patient was brought to the operating room with IV running. Timeout was carried out. Epidural anesthesia was noted to be adequate. Uterus was 2 cm below the fundus. She was prepped and draped in standard fashion. Foley was draining minimal urine but clear.  A 10 mm curvilinear  incision was made below the umbilicus and incision was carried down to the fascia, fascia was grasped with Kochers and incised, peritoneal entry was made. Retractors placed. Uterine fundus was seen well. Right fallopian tube grasped with Tanja PortBabcock and travelled to fimbria and tube confirmed. Mid portion grapsed again and 2 free ties of 2-0 Plain gut was tied and loop cut, sent to path. Hemostasis was excellent. Tied ends returned back to abdomen. Left tube grasped with Tanja PortBabcock and travelled to fimbria and tube confirmed. Mid portion grasped and two ties of 2-0 Plain gut were tied and loop was cut off and sent to path. Tied ends were hemostatic and sent back to peritoneal cavity. Sponge lap was removed. Fascia sutured with 0-Vicryl. Skin  sutured with 4-0 Vicryl. Sterile dressing placed.  All counts were correct x2,  Patient sent to PACU and then to floor.   V.Elayne Gruver, MD

## 2014-06-15 NOTE — Progress Notes (Signed)
Patient ID: [name], female   DOB: Aug 08, 1986, 28 y.o.   MRN: [id number] PPD # 1 SVD  S:  Reports feeling well             Tolerating po/ No nausea or vomiting             Bleeding is light             Pain controlled with ibuprofen (OTC)             Up ad lib / ambulatory / voiding without difficulties    Newborn  Information for the patient's newborn:  [name], [name] [[id number]][[id number]]  female  breast feeding    O:  A & O x 3, in no apparent distress              VS:  Filed Vitals:   06/15/14 1045 06/15/14 1100 06/15/14 1126 06/15/14 1233  BP: 98/65 95/67 118/61 123/69  Pulse: 83 79 85 85  Temp:   98.3 F (36.8 C) 98.5 F (36.9 C)  TempSrc:      Resp: 12 20 18 18   Height:      Weight:      SpO2: 98% 97% 98% 99%    LABS:  Recent Labs  06/14/14 2300 06/15/14 0635  WBC 17.5* 14.3*  HGB 14.2 13.6  HCT 40.3 38.1  PLT 171 173    Blood type: O POS (10/08 0747)  Rubella: Immune (03/13 0000)   I&O: I/O last 3 completed shifts: In: -  Out: 650 [Urine:250; Blood:400]          Total I/O In: 2000 [I.V.:2000] Out: 255 [Urine:250; Blood:5]  Lungs: Clear and unlabored  Heart: regular rate and rhythm / no murmurs  Abdomen: soft, appropriately tender, non-distended              Fundus: firm, non-tender, U-1  Perineum: intact, no edema  Lochia: minimal  Extremities: no edema, no calf pain or tenderness, no Homans - SCD hose in place    A/P: PPD # 1  28 y.o., [id number]   Principal Problem:    Postpartum care following vaginal delivery (10/8)  Active Problems:    Pregnancy    SVD (spontaneous vaginal delivery)  S/P Bilateral Tubal Ligation (10/9)   Doing well - stable status  Routine post partum orders  Anticipate discharge tomorrow    Raelyn MoraAWSON, Sanjeev Main, M, MSN, CNM 06/15/2014, 1:32 PM

## 2014-06-15 NOTE — Progress Notes (Signed)
Patient ID: [name], female   DOB: 1986/03/16, 28 y.o.   MRN: [id number] Subjective: POD# 0   Reports feeling sore, but well Pain controlled with ibuprofen (OTC) Denies HA/SOB/C/P/N/V/dizziness. Flatus present - "a little". She reports vaginal bleeding as normal, without clots.  She is ambulating, urinating without difficult.     Objective:   VS:  Filed Vitals:   06/15/14 1045 06/15/14 1100 06/15/14 1126 06/15/14 1233  BP: 98/65 95/67 118/61 123/69  Pulse: 83 79 85 85  Temp:   98.3 F (36.8 C) 98.5 F (36.9 C)  TempSrc:      Resp: 12 20 18 18   Height:      Weight:      SpO2: 98% 97% 98% 99%     Intake/Output Summary (Last 24 hours) at 06/15/14 1337 Last data filed at 06/15/14 1100  Gross per 24 hour  Intake   2000 ml  Output    905 ml  Net   1095 ml        Recent Labs  06/14/14 2300 06/15/14 0635  WBC 17.5* 14.3*  HGB 14.2 13.6  HCT 40.3 38.1  PLT 171 173     Physical Exam:  General: alert, cooperative and no distress CV: Regular rate and rhythm, S1S2 present or without murmur or extra heart sounds Resp: clear Abdomen: soft, nontender, normal bowel sounds Incision: Tegaderm and Honeycomb dressing covering umbilicus C/D/I - skin well-approximated with sutures Uterine Fundus: firm, 1 FB below umbilicus, appropriately tender post surgery Lochia: minimal Ext: extremities normal, atraumatic, no cyanosis or edema and Homans sign is negative, no sign of DVT - SCD hose in place   Assessment/Plan: 28 y.o.   POD# 0. / [id number] / S/P Bilateral Tubal Ligation.  Indications: permanent sterilization                Principal Problem:   Postpartum care following vaginal delivery (10/8) Active Problems:   Pregnancy   SVD (spontaneous vaginal delivery)   S/P bilateral tubal ligation (10/9)  Doing well, stable.               Regular diet as tolerated D/C foley per protocol Ambulate Routine post-op care Anticipate d/c home tomorrow  Lori MoraAWSON, [name], M, MSN,  CNM 06/15/2014, 1:37 PM

## 2014-06-15 NOTE — Anesthesia Postprocedure Evaluation (Signed)
Anesthesia Post Note  Patient: [name]  Procedure(s) Performed: Procedure(s) (LRB): POST PARTUM TUBAL LIGATION (N/A)  Anesthesia type: Epidural  Patient location: PACU  Post pain: Pain level controlled  Post assessment: Post-op Vital signs reviewed  Last Vitals:  Filed Vitals:   06/15/14 1100  BP: 95/67  Pulse: 79  Temp:   Resp: 20    Post vital signs: Reviewed  Level of consciousness: awake  Complications: No apparent anesthesia complications

## 2014-06-15 NOTE — Anesthesia Postprocedure Evaluation (Signed)
  Anesthesia Post-op Note  Patient: [name]  Procedure(s) Performed: Procedure(s): POST PARTUM TUBAL LIGATION (N/A)  Patient Location: Mother/Baby  Anesthesia Type:Epidural  Level of Consciousness: awake, alert  and oriented  Airway and Oxygen Therapy: Patient Spontanous Breathing  Post-op Pain: none  Post-op Assessment: Post-op Vital signs reviewed, Patient's Cardiovascular Status Stable, Respiratory Function Stable, No headache, No backache, No residual numbness and No residual motor weakness  Post-op Vital Signs: Reviewed and stable  Last Vitals:  Filed Vitals:   06/15/14 1730  BP: 115/73  Pulse: 81  Temp: 36.7 C  Resp: 18    Complications: No apparent anesthesia complications

## 2014-06-15 NOTE — Lactation Note (Signed)
This note was copied from the chart of Lori Severiano [name]. Lactation Consultation Note  Initial visit done.  Breastfeeding consultation services and support information given to patient.  Mom states she breastfed her previous 4 babies without problems. She states newborn is latching easily but still somewhat sleepy.  Baby is currently at the breast with good latch observed.  Instructed mom to feed with any feeding cue and to use good breast massage and compression during feeding.  Encouraged to call with concerns/assist prn.  Patient Name: [name] Today's Date: 06/15/2014 Reason for consult: Initial assessment   Maternal Data Does the patient have breastfeeding experience prior to this delivery?: Yes  Feeding Feeding Type: Breast Fed Length of feed: 30 min  LATCH Score/Interventions Latch: Grasps breast easily, tongue down, lips flanged, rhythmical sucking. Intervention(s): Skin to skin  Audible Swallowing: A few with stimulation  Type of Nipple: Everted at rest and after stimulation  Comfort (Breast/Nipple): Soft / non-tender     Hold (Positioning): No assistance needed to correctly position infant at breast.  LATCH Score: 9  Lactation Tools Discussed/Used     Consult Status Consult Status: PRN    Huston FoleyMOULDEN, Iman Orourke S 06/15/2014, 5:05 PM

## 2014-06-15 NOTE — Progress Notes (Signed)
Patient ID: [name], female   DOB: 1986-06-04, 28 y.o.   MRN: [id number] Desires PPTL, permanent sterilization. Healthy, multigravida. Uncomplicated SVD 10/8. Procedure reviewed. Risks/complications of surgery reviewed incl infection, bleeding, damage to internal organs including bladder, bowels, ureters, blood vessels, other risks from anesthesia, VTE and delayed complications of any surgery, complications in future surgery reviewed.  Also discussed infertility/ failure with ectopic preg risk and risk of regret. Pt voiced understanding.  Denies any complaints.  BP 122/65  Pulse 90  Temp(Src) 98.4 F (36.9 C) (Oral)  Resp 18  Ht 5\' 8"  (1.727 m)  Wt 203 lb (92.08 kg)  BMI 30.87 kg/m2  SpO2 97%  Breastfeeding? Unknown  Exam normal. Fundus 2 cm below umb.  Lori Lamay, MD

## 2014-06-16 MED ORDER — IBUPROFEN 600 MG PO TABS: 600.0000 mg | ORAL_TABLET | Freq: Four times a day (QID) | ORAL | Status: DC

## 2014-06-16 MED ORDER — INFLUENZA VAC SPLIT QUAD 0.5 ML IM SUSY: 0.5000 mL | PREFILLED_SYRINGE | Freq: Once | INTRAMUSCULAR | Status: DC

## 2014-06-16 MED ORDER — OXYCODONE-ACETAMINOPHEN 5-325 MG PO TABS: 1.0000 | ORAL_TABLET | ORAL | Status: DC | PRN

## 2014-06-16 NOTE — Progress Notes (Signed)
PPD #2- SVD  Subjective:   Reports feeling well Tolerating po/ No nausea or vomiting Bleeding is light Pain controlled with Motrin and Percocet Up ad lib / ambulatory / voiding without problems Newborn: breastfeeding     Objective:   VS: VS:  Filed Vitals:   06/15/14 2130 06/16/14 0140 06/16/14 0540 06/16/14 0930  BP: 120/71 104/62 110/73 119/74  Pulse: 92 77 82 84  Temp: 97.9 F (36.6 C) 97.7 F (36.5 C) 97.5 F (36.4 C) 98 F (36.7 C)  TempSrc: Oral Oral Oral Oral  Resp: 18 18 18 18   Height:      Weight:      SpO2: 98% 95% 100%     LABS:  Recent Labs  06/14/14 2300 06/15/14 0635  WBC 17.5* 14.3*  HGB 14.2 13.6  PLT 171 173   Blood type: --/--/O POS, O POS (10/08 0747) Rubella: Immune (03/13 0000)                I&O: Intake/Output     10/09 0701 - 10/10 0700 10/10 0701 - 10/11 0700   I.V. (mL/kg) 2000 (21.7)    Total Intake(mL/kg) 2000 (21.7)    Urine (mL/kg/hr) 600 (0.3)    Blood 5 (0)    Total Output 605     Net +1395            Physical Exam: Alert and oriented X3 Abdomen: soft, non-tender, non-distended; incision to umbilicus intact w/o redness or drainage, honeycomb dsg c/d/i Fundus: firm, non-tender, U-1 Perineum: intact Lochia: small Extremities: No edema, no calf pain or tenderness    Assessment: PPD # 2 G6P5014/ S/P:spontaneous vaginal S/p BTL Doing well - stable for discharge home   Plan: Discharge home RX's:  Ibuprofen 600mg  po Q 6 hrs prn pain #30 Refill x 0 Percocet 5/325 1 to 2 po Q 4 hrs prn pain #30 Refill x 0 Routine pp visit in [facility] booklet given    Donette LarryBHAMBRI, Dalanie Kisner, N MSN, CNM 06/16/2014, 10:36 AM

## 2014-06-16 NOTE — Discharge Summary (Signed)
Obstetric Discharge Summary Reason for Admission: induction of labor Prenatal Procedures: preplacental cyst, h/o IUFD Intrapartum Procedures: spontaneous vaginal delivery Postpartum Procedures: P.P. tubal ligation Complications-Operative and Postpartum: none HGB  Date Value Ref Range Status  10/26/2011 14.1  11.6 - 15.9 g/dL Final     Hemoglobin  Date Value Ref Range Status  06/15/2014 13.6  12.0 - 15.0 g/dL Final     HCT  Date Value Ref Range Status  06/15/2014 38.1  36.0 - 46.0 % Final  10/26/2011 41.0  34.8 - 46.6 % Final    Physical Exam:  General: alert and cooperative Lochia: appropriate Uterine Fundus: firm Incision: healing well, no significant drainage, no dehiscence, no significant erythema DVT Evaluation: No evidence of DVT seen on physical exam. Negative Homan's sign. No cords or calf tenderness. No significant calf/ankle edema.  Discharge Diagnoses: Term Pregnancy-delivered , S/p BTL  Discharge Information: Date: 06/16/2014 Activity: pelvic rest Diet: routine Medications: PNV, Ibuprofen and Percocet Condition: stable Instructions: refer to practice specific booklet Discharge to: home Follow-up Information   Follow up with MODY,VAISHALI R, MD. Schedule an appointment as soon as possible for a visit in 6 weeks.   Specialty:  Obstetrics and Gynecology   Contact information:   [address]is Gash[address] [place] [id number] [phone]       Newborn Data: Live born female on 06/14/14 Birth Weight: 7 lb 11.8 oz (3510 g) APGAR: 8, 9  Home with mother.  Adriyana Greenbaum, N 06/16/2014, 12:04 PM

## 2014-06-18 ENCOUNTER — Inpatient Hospital Stay (HOSPITAL_COMMUNITY)
Admission: AD | Admit: 2014-06-18 | Payer: Managed Care, Other (non HMO) | Source: Ambulatory Visit | Admitting: Obstetrics & Gynecology

## 2014-06-18 ENCOUNTER — Encounter (HOSPITAL_COMMUNITY): Payer: Self-pay | Admitting: Obstetrics & Gynecology

## 2014-07-09 ENCOUNTER — Encounter (HOSPITAL_COMMUNITY): Payer: Self-pay | Admitting: Obstetrics & Gynecology

## 2015-02-09 ENCOUNTER — Encounter (HOSPITAL_BASED_OUTPATIENT_CLINIC_OR_DEPARTMENT_OTHER): Payer: Self-pay | Admitting: *Deleted

## 2015-02-09 ENCOUNTER — Emergency Department (HOSPITAL_BASED_OUTPATIENT_CLINIC_OR_DEPARTMENT_OTHER)
Admission: EM | Admit: 2015-02-09 | Discharge: 2015-02-09 | Disposition: A | Discharge status: Home / Self Care | Payer: Managed Care, Other (non HMO) | Attending: Emergency Medicine | Admitting: Emergency Medicine

## 2015-02-09 ENCOUNTER — Emergency Department (HOSPITAL_BASED_OUTPATIENT_CLINIC_OR_DEPARTMENT_OTHER): Payer: Managed Care, Other (non HMO)

## 2015-02-09 DIAGNOSIS — T148XXA Other injury of unspecified body region, initial encounter: Secondary | ICD-10-CM

## 2015-02-09 DIAGNOSIS — Z791 Long term (current) use of non-steroidal anti-inflammatories (NSAID): Secondary | ICD-10-CM | POA: Diagnosis not present

## 2015-02-09 DIAGNOSIS — S4991XA Unspecified injury of right shoulder and upper arm, initial encounter: Secondary | ICD-10-CM | POA: Insufficient documentation

## 2015-02-09 DIAGNOSIS — J45909 Unspecified asthma, uncomplicated: Secondary | ICD-10-CM | POA: Diagnosis not present

## 2015-02-09 DIAGNOSIS — Z8659 Personal history of other mental and behavioral disorders: Secondary | ICD-10-CM | POA: Insufficient documentation

## 2015-02-09 DIAGNOSIS — Y9389 Activity, other specified: Secondary | ICD-10-CM | POA: Diagnosis not present

## 2015-02-09 DIAGNOSIS — Y9289 Other specified places as the place of occurrence of the external cause: Secondary | ICD-10-CM | POA: Diagnosis not present

## 2015-02-09 DIAGNOSIS — Z87440 Personal history of urinary (tract) infections: Secondary | ICD-10-CM | POA: Diagnosis not present

## 2015-02-09 DIAGNOSIS — M25551 Pain in right hip: Secondary | ICD-10-CM

## 2015-02-09 DIAGNOSIS — M25511 Pain in right shoulder: Secondary | ICD-10-CM

## 2015-02-09 DIAGNOSIS — T1490XA Injury, unspecified, initial encounter: Secondary | ICD-10-CM

## 2015-02-09 DIAGNOSIS — Y998 Other external cause status: Secondary | ICD-10-CM | POA: Insufficient documentation

## 2015-02-09 DIAGNOSIS — S79911A Unspecified injury of right hip, initial encounter: Secondary | ICD-10-CM | POA: Insufficient documentation

## 2015-02-09 DIAGNOSIS — W01198A Fall on same level from slipping, tripping and stumbling with subsequent striking against other object, initial encounter: Secondary | ICD-10-CM | POA: Diagnosis not present

## 2015-02-09 DIAGNOSIS — S8001XA Contusion of right knee, initial encounter: Secondary | ICD-10-CM | POA: Insufficient documentation

## 2015-02-09 DIAGNOSIS — R52 Pain, unspecified: Secondary | ICD-10-CM

## 2015-02-09 NOTE — ED Provider Notes (Signed)
CSN: [id number]     Arrival date & time 02/09/15  1725 History   First MD Initiated Contact with Patient 02/09/15 1930     Chief Complaint  Patient presents with  . Fall     (Consider location/radiation/quality/duration/timing/severity/associated sxs/prior Treatment) HPI   Lori ContrasGrace L Collins is a 29 y.o. female instructed to come to the ED by her lawyers for evaluation of right shoulder and right hip pain patient is status post slip and fall 5 days ago. She denies any head trauma, loss of consciousness, cervicalgia, chest pain, shortness of breath, abdominal pain, inability to ambulate. She states she has 8 out of 10 right shoulder pain. She denies reduced range of motion, numbness, weakness, history of trauma or surgeries to the affected joints. Patient states she was seen at an urgent care center on Wednesday for evaluation of right knee pain. Patient has been given Tylenol No. 3 and Robaxin at home, she states that this is not helping.  Past Medical History  Diagnosis Date  . Asthma 2011    pregnancy induced  . Depression 2011  . Incomplete miscarriage 03/24/2011  . Hx of intrauterine growth retardation and stillbirth, currently pregnant   . UTI (lower urinary tract infection)   . Normal labor 06/16/2012  . Postpartum care and examination immediately after delivery (06/16/12) 06/17/2012  . S/P bilateral tubal ligation (10/9) 06/15/2014   Past Surgical History  Procedure Laterality Date  . No past surgeries    . Dilation and evacuation  03/24/2011    Procedure: DILATATION AND EVACUATION (D&E);  Surgeon: Janine LimboArthur V Stringer, MD;  Location: [facility];  Service: Gynecology;  Laterality: N/A;  . Tubal ligation N/A 06/15/2014    Procedure: POST PARTUM TUBAL LIGATION;  Surgeon: Robley FriesVaishali R Mody, MD;  Location: [facility];  Service: Gynecology;  Laterality: N/A;   Family History  Problem Relation Age of Onset  . Dwarfism Sister   . Diabetes Maternal Grandfather   . Heart attack Paternal Grandfather     History  Substance Use Topics  . Smoking status: Never Smoker   . Smokeless tobacco: Not on file  . Alcohol Use: Yes     Comment: not since pregnancy   OB History    Gravida Para Term Preterm AB TAB SAB Ectopic Multiple Living   6 5 5  0 1 0 1 0 0 4     Review of Systems  10 systems reviewed and found to be negative, except as noted in the HPI.   Allergies  Bee venom  Home Medications   Prior to Admission medications   Medication Sig Start Date End Date Taking? Authorizing Provider  ibuprofen (ADVIL,MOTRIN) 600 MG tablet Take 1 tablet (600 mg total) by mouth every 6 (six) hours. 06/16/14   Lawernce PittsMelanie N Bhambri, CNM  oxyCODONE-acetaminophen (PERCOCET/ROXICET) 5-325 MG per tablet Take 1-2 tablets by mouth every 4 (four) hours as needed (for pain scale less than 7). 06/16/14   Lawernce PittsMelanie N Bhambri, CNM  Prenatal Vit-Fe Fumarate-FA (PRENATAL MULTIVITAMIN) TABS Take 1 tablet by mouth daily.    Historical Provider, MD   Pulse 82  Temp(Src) 98.5 F (36.9 C) (Oral)  Resp 22  Wt 173 lb 11.2 oz (78.79 kg)  SpO2 98%  LMP 09/08/2011  Breastfeeding? Yes Physical Exam  Constitutional: She is oriented to person, place, and time. She appears well-developed and well-nourished.  HENT:  Head: Normocephalic and atraumatic.  Mouth/Throat: Oropharynx is clear and moist.  No abrasions or contusions.   No hemotympanum, battle  signs or raccoon's eyes  No crepitance or tenderness to palpation along the orbital rim.  EOMI intact with no pain or diplopia  No abnormal otorrhea or rhinorrhea. Nasal septum midline.  No intraoral trauma.  Eyes: Conjunctivae and EOM are normal. Pupils are equal, round, and reactive to light.  Neck: Normal range of motion. Neck supple.  No midline C-spine  tenderness to palpation or step-offs appreciated. Patient has full range of motion without pain.  Grip/Biceps/Tricep strength 5/5 bilaterally, sensation to UE intact bilaterally.    Cardiovascular: Normal  rate, regular rhythm and intact distal pulses.   Pulmonary/Chest: Effort normal and breath sounds normal. No respiratory distress. She has no wheezes. She has no rales. She exhibits no tenderness.  No TTP or crepitance  Abdominal: Soft. Bowel sounds are normal. She exhibits no distension and no mass. There is no tenderness. There is no rebound and no guarding.  Musculoskeletal: Normal range of motion. She exhibits no edema or tenderness.  Right Shoulder: Shoulder with no deformity. FROM to shoulder and elbow. No TTP of rotator cuff musculature. Drop arm negative. Neurovascularly intact   Right Hip: No ecchymoses or skin changes. Full active range of motion.  Right Knee: +Ecchymosis,  FROM. No effusion or crepitance. Anterior and posterior drawer show no abnormal laxity. Stable to valgus and varus stress. Joint lines are non-tender. Neurovascularly intact. Pt ambulates with non-antalgic gait.    Pelvis stable. No deformity or TTP of major joints.   Good ROM  Neurological: She is alert and oriented to person, place, and time.  II-Visual fields grossly intact. III/IV/VI-Extraocular movements intact.  Pupils reactive bilaterally. V/VII-Smile symmetric, equal eyebrow raise,  facial sensation intact VIII- Hearing grossly intact IX/X-Normal gag XI-bilateral shoulder shrug XII-midline tongue extension Motor: 5/5 bilaterally with normal tone and bulk Cerebellar: Normal finger-to-nose  and normal heel-to-shin test.   Romberg negative Ambulates with a coordinated and non-antalgic gait   Skin: Skin is warm.  Ecchymoses to patella of right knee.  Psychiatric: She has a normal mood and affect.  Nursing note and vitals reviewed.   ED Course  Procedures (including critical care time) Labs Review Labs Reviewed - No data to display  Imaging Review Dg Shoulder Left  02/09/2015   CLINICAL DATA:  Left shoulder pain after fall 3 days prior.  EXAM: LEFT SHOULDER - 2+ VIEW  COMPARISON:  None.   FINDINGS: No fracture or dislocation. The alignment and joint spaces are maintained. No soft tissue calcifications.  IMPRESSION: Negative.   Electronically Signed   By: Rubye Oaks M.D.   On: 02/09/2015 21:06   Dg Hip Unilat With Pelvis 2-3 Views Right  02/09/2015   CLINICAL DATA:  RIGHT hip and LEFT shoulder pain, fell 3 days ago, injury, initial encounter  EXAM: RIGHT HIP (WITH PELVIS) 2-3 VIEWS  COMPARISON:  None  FINDINGS: Osseous mineralization normal.  Hip and SI joint spaces symmetric and preserved.  No acute fracture, dislocation, or bone destruction.  IMPRESSION: Normal exam.   Electronically Signed   By: Ulyses Southward M.D.   On: 02/09/2015 21:08     EKG Interpretation None      MDM   Final diagnoses:  Pain  Injury  Right shoulder pain  Right hip pain  Bruise    Filed Vitals:   02/09/15 1739  Pulse: 82  Temp: 98.5 F (36.9 C)  TempSrc: Oral  Resp: 22  Weight: 173 lb 11.2 oz (78.79 kg)  SpO2: 98%    Lori Contras is  a pleasant 29 y.o. female presenting with right shoulder and hip pain status post slip and fall 5 days ago. Physical exam shows excellent range of motion, no objective signs of trauma to the shoulder or hip. Patient ambulates with a coordinated in nonantalgic gait. X-rays are negative. Patient will be given referral to sports medicine in light of persistent symptoms. Recommend high-dose anti-inflammatory's for pain control.  Evaluation does not show pathology that would require ongoing emergent intervention or inpatient treatment. Pt is hemodynamically stable and mentating appropriately. Discussed findings and plan with patient/guardian, who agrees with care plan. All questions answered. Return precautions discussed and outpatient follow up given.       Wynetta Emery, PA-C 02/09/15 2117  Geoffery Lyons, MD 02/09/15 2238

## 2015-02-09 NOTE — Discharge Instructions (Signed)
Your X-rays today show no abnormality.  For pain control please take ibuprofen (also known as Motrin or Advil) 800mg  (this is normally 4 over the counter pills) 3 times a day  for 5 days. Take with food to minimize stomach irritation.  Do not hesitate to return to the emergency room for any new, worsening or concerning symptoms.  Please obtain primary care using resource guide below. Let them know that you were seen in the emergency room and that they will need to obtain records for further outpatient management.    Emergency Department Resource Guide 1) Find a Doctor and Pay Out of Pocket Although you won't have to find out who is covered by your insurance plan, it is a good idea to ask around and get recommendations. You will then need to call the office and see if the doctor you have chosen will accept you as a new patient and what types of options they offer for patients who are self-pay. Some doctors offer discounts or will set up payment plans for their patients who do not have insurance, but you will need to ask so you aren't surprised when you get to your appointment.  2) Contact Your Local Health Department Not all health departments have doctors that can see patients for sick visits, but many do, so it is worth a call to see if yours does. If you don't know where your local health department is, you can check in your phone book. The CDC also has a tool to help you locate your state's health department, and many state websites also have listings of all of their local health departments.  3) Find a Walk-in Clinic If your illness is not likely to be very severe or complicated, you may want to try a walk in clinic. These are popping up all over the country in pharmacies, drugstores, and shopping centers. They're usually staffed by nurse practitioners or physician assistants that have been trained to treat common illnesses and complaints. They're usually fairly quick and inexpensive. However,  if you have serious medical issues or chronic medical problems, these are probably not your best option.  No Primary Care Doctor: - Call Health Connect at  [phone] - they can help you locate a primary care doctor that  accepts your insurance, provides certain services, etc. - Physician Referral Service- [phone]  Chronic Pain Problems: Organization         Address  Phone   Notes  [facility]  [phone] Patients need to be referred by their primary care doctor.   Medication Assistance: Organization         Address  Phone   Notes  [facility] Adassistance Program [address] [place], [place] [address] [phone] --Must be a resident of [facility] [place] -- Must have NO insurance coverage whatsoever (no Medicaid/ Medicare, etc.) -- The pt. MUST have a primary care doctor that directs their care regularly and follows them in the community   MedAssist  [phone]   [employer]  [phone]    Agencies that provide inexpensive medical care: Organization         Address  Phone   Notes  [facility]  [phone]   [facility]    [phone]   [facility] [address] [place], [place] [address] [phone]   [facility] [address], [place] [phone]  Planned Parenthood    [phone]   [facility]    [phone]   Community Health and [address], [place] Phone:  [phone], Fax:  [phone] Hours of Operation:  9 am - 6 pm, M-F.  Also accepts Medicaid/Medicare and self-pay.  [facility] for [address] [place], [address], [place] Phone: [phone], Fax: [phone]. Hours of Operation:  8:30 am - 5:30 pm, M-F.  Also accepts Medicaid and self-pay.  [facility] [address] [place] Phone: [phone]   [place], [place], [place] [phone], Ext. [phone] Mondays & Thursdays: 7-9 AM.  First 15 patients are seen on a first come, first serve basis.    [place] Providers:  Organization         Address  Phone   Notes  [facility] [address], [place] [phone] Also accepts self-pay patients.  [facility] [address], [place]  [phone]   [place], [address], [place] [phone]   [facility] [address], [place] [phone]   Lucianne Lei [address], [place]   [phone] Only accepts [place] Access [place] patients after they have their name applied to their card.   Self-Pay (no insurance) in [facility]:  Organization         Address  Phone   Notes  Sickle Cell Patients, [facility] [place] [phone]   [facility] [place] [phone]   [facility] [place]  [place], [place], [place] [phone]   [facility]/Dr. Osei-Bonsu  [address], [place] or [address], [place] [phone] Phone number for both [place] and [place] locations is the same.  [facility] [address], [place] [phone]   [facility] [address] [place] or [address] [phone]   [facility] [address] [place] [phone], phone; [phone], fax Sees patients 1st and 3rd Saturday of every month.  Must not qualify for public or private insurance (i.e. Medicaid, Medicare, [place] Health Choice, Veterans' Benefits)  Household income should be no more than 200% of the poverty level The clinic cannot treat you if you are pregnant or think you are  pregnant  Sexually transmitted diseases are not treated at the clinic.    Dental Care: Organization         Address  Phone  Notes  [facility] of [facility] [place] [phone] Accepts children up to age 33 who are enrolled in [place] or [place]; pregnant women with a Medicaid card; and children who have applied for Medicaid or [place] Health Choice, but were declined, whose parents can pay a reduced fee at time of service.  [facility] of [facility]  [address], [place] [phone] Accepts children up to age 110 who are enrolled in [place] or [place]; pregnant women with a Medicaid card; and children who have applied for Medicaid or [place], but were declined,  whose parents can pay a reduced fee at time of service.  [place] [facility] Access PROGRAM  [place] [phone] Patients are seen by appointment only. Walk-ins are not accepted. [place] will see patients 59 years of age and older. Monday - Tuesday (8am-5pm) Most Wednesdays (8:30-5pm) $30 per visit, cash only  [facility] Access PROGRAM  [address], [facility] [phone] Patients are seen by appointment only. Walk-ins are not accepted. [place] will see patients 72 years of age and older. One Wednesday Evening (Monthly: Volunteer Based).  $30 per visit, cash only  [place]  [phone] for adults; Children under age 53, call Graduate Pediatric Dentistry at [phone]. Children aged 3-14, please call [phone] to request a pediatric application.  Dental services are provided in all areas of dental care including fillings, crowns and bridges, complete and partial dentures, implants, gum treatment, root canals, and extractions. Preventive care is also provided. Treatment is provided to both adults and  children. Patients are selected via a lottery and there is often a waiting list.   [facility] [address], [place]  [phone] [email or web address]   [facility] [address] [place], [place] [phone], Ext. [phone] Second and Fourth Thursday of each month, opens at 6:30 AM; Clinic ends at 9 AM.  Patients are seen on a first-come first-served basis, and a limited number are seen during each clinic.   [facility]  [address] Hillard Danker [place], [place] [phone]   Eligibility Requirements You must have lived in [place], [place], or [place] counties for at least the last three months.   You cannot be eligible for state or federal sponsored [employer], including [employer], [place], or [employer].   You generally cannot be eligible for healthcare insurance through your employer.    How to apply: Eligibility screenings are held every Tuesday and Wednesday afternoon from 1:00 pm until 4:00 pm. You do not need an appointment for the interview!  [facility] [address], [place], [place]   [place]  [place] Department  [place]  [phone]    Behavioral Health Resources in the Community: Intensive Outpatient Programs Organization         Address  Phone  Notes  [place]. [address], [place], [place] [phone]   [facility]patient [address] [place], [place]   ADS: Alcohol & Drug Svcs [address] [place], [place]   [place] [address]  [place], [place] or [phone]   Substance Abuse Resources Organization         Address  Phone  Notes  Alcohol and Drug Services  [phone]   [place]  [phone]   [facility]    Chinita Pester  [phone]   Residential & Outpatient Substance Abuse Program  [phone]   Psychological Services Organization         Address  Phone  Notes  [facility] [place]  [place]  [phone]   [place] [address] [place] or [phone]    Mobile Crisis Teams Organization         Address  Phone  Notes  [facility]rapeutic Alternatives, Mobile  Crisis Care Unit  [phone]   [facility]  [address] [place], [place]   [facility] [address] [place] [phone]    Self-Help/Support Groups Organization         Address  Phone             Notes  [place]. of [place] - variety of support groups  [place] Call for more information  Narcotics Anonymous (NA), [facility] [address], [employer] [place]  2 meetings at this location   Special educational needs teacher         Address  Phone  Notes  [facility] Residential Treatment [place],    [place]  [phone]   [facility]  [address], [place] [address], [place], [place]   [place], [place] [phone] Admissions: 8am-3pm M-F  Incentives Substance [place] [address],    [place], [place] [phone]   [facility] [address] [place], [place], [place]   [facility] [address],  [place], [place]   [facility] - Intensive Outpatient [address] Kristeen [address], [place], [place]   [facility] ([place] [address].,  [place], [place] [phone] or [phone]   [facility] (RTS) [address] [place], [place] Accepts Medicaid  [facility] [place] [address]  [place] [phone] Substance Abuse/Addiction Treatment   [facility] Organization         Address  Phone  Notes  [facility]  [phone]   Domenic Schwab, PhD [address] Arlis [place], [place]   [phone] or [phone]   [facility] [place], [place] [phone]   [facility] [address] [place], [place] [phone] Insurance/Medicaid/sponsorship through [facility] and Families [address] [place]                                    [employer], [place] [phone] [place] [address], [place] [phone]    Dr. Adele Schilder  [phone]   [facility] Dept. 1) [address] [place] 2) [place] 3)  [place] [address], [place] [phone]  [phone]   [place] [phone] or [phone] (After Hours)
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
## 2015-02-14 ENCOUNTER — Ambulatory Visit: Payer: Self-pay | Admitting: Family Medicine

## 2015-09-19 IMAGING — CR DG HIP (WITH OR WITHOUT PELVIS) 2-3V*R*
3 series · 3 of 3 positions shown · non-contrast
Comparison: None

CLINICAL DATA: RIGHT hip and LEFT shoulder pain, fell 3 days ago,
injury, initial encounter

EXAM:
RIGHT HIP (WITH PELVIS) 2-3 VIEWS

[t pelvis a.p.]
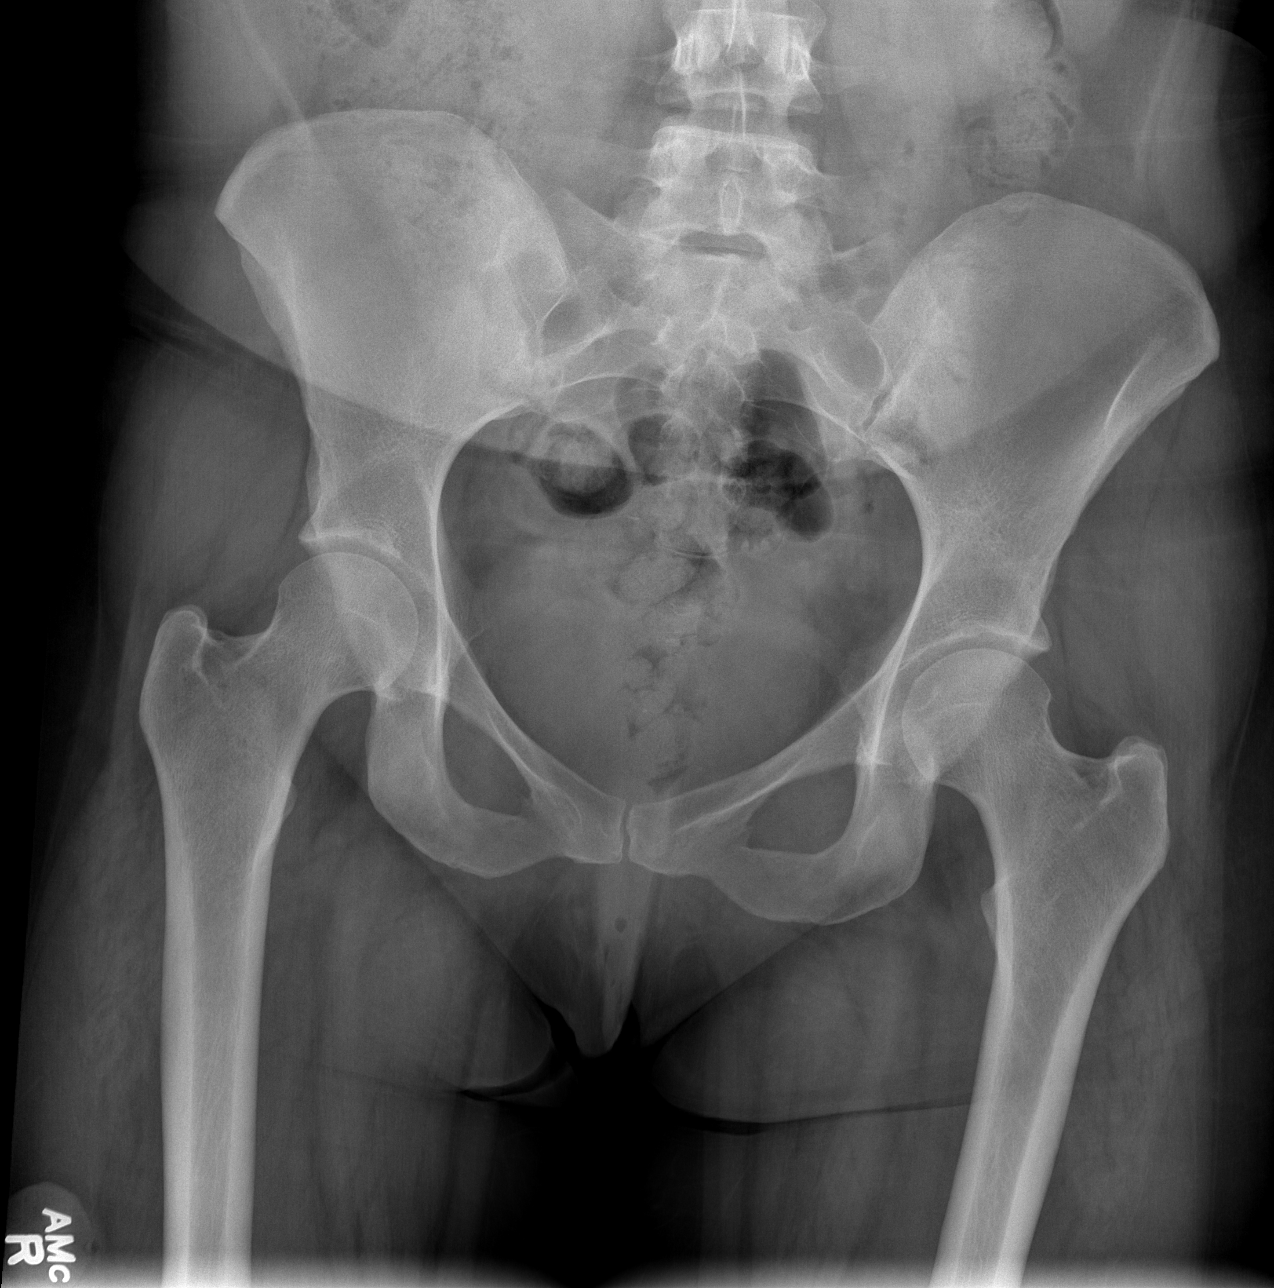

[t hip ap right]
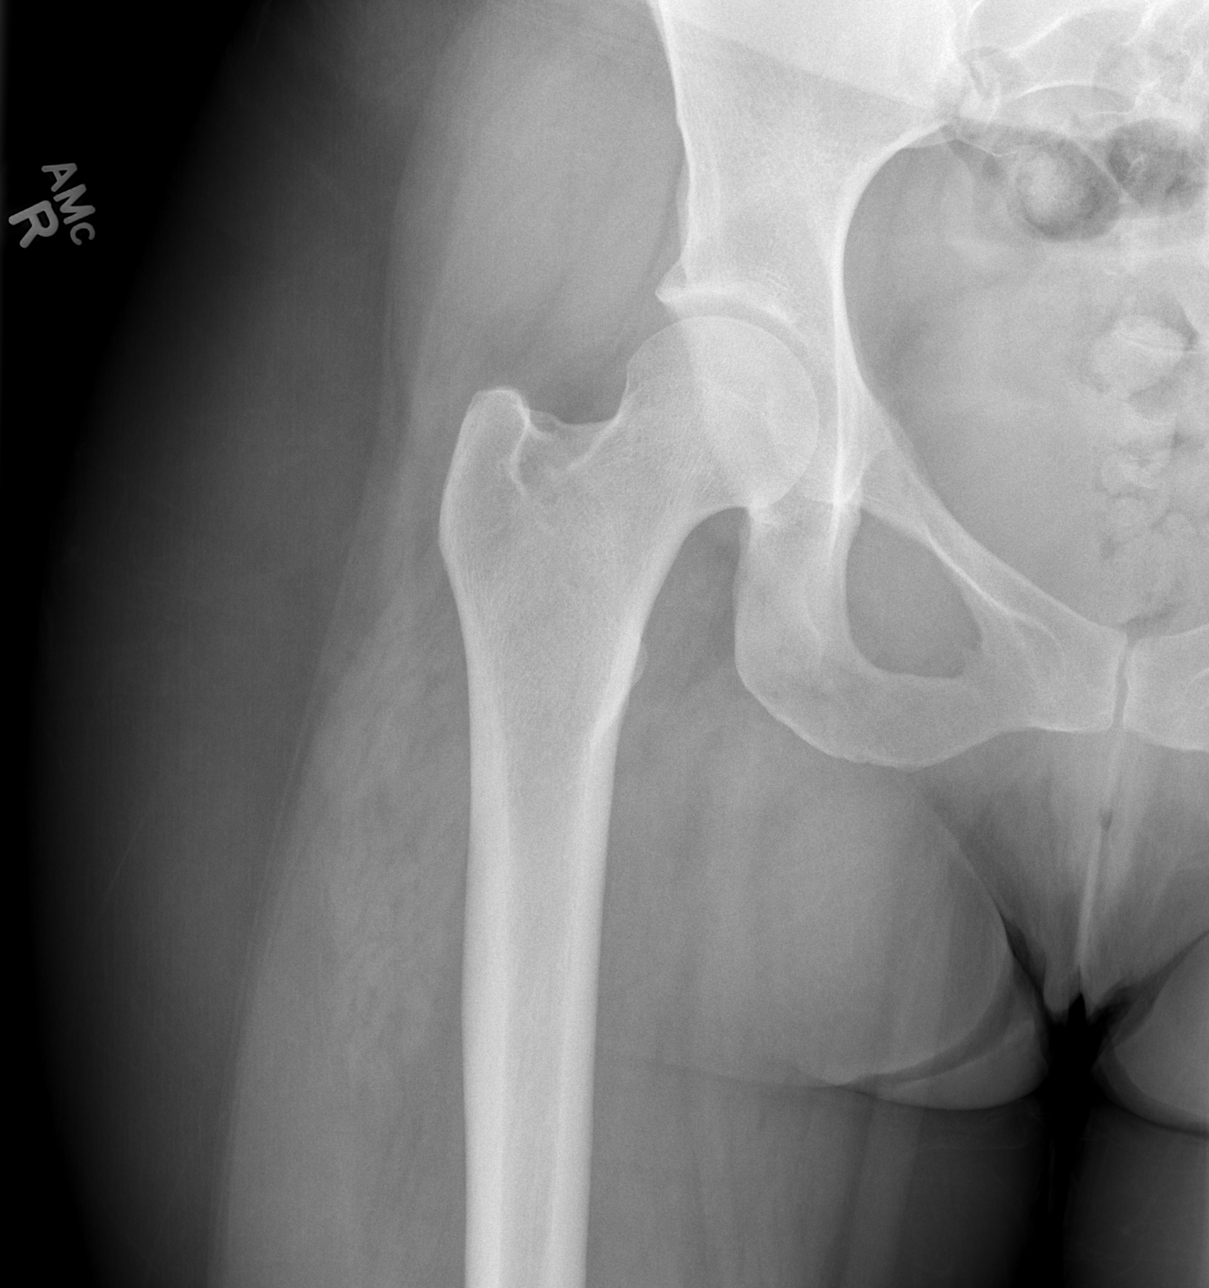

[t hip frog leg right]
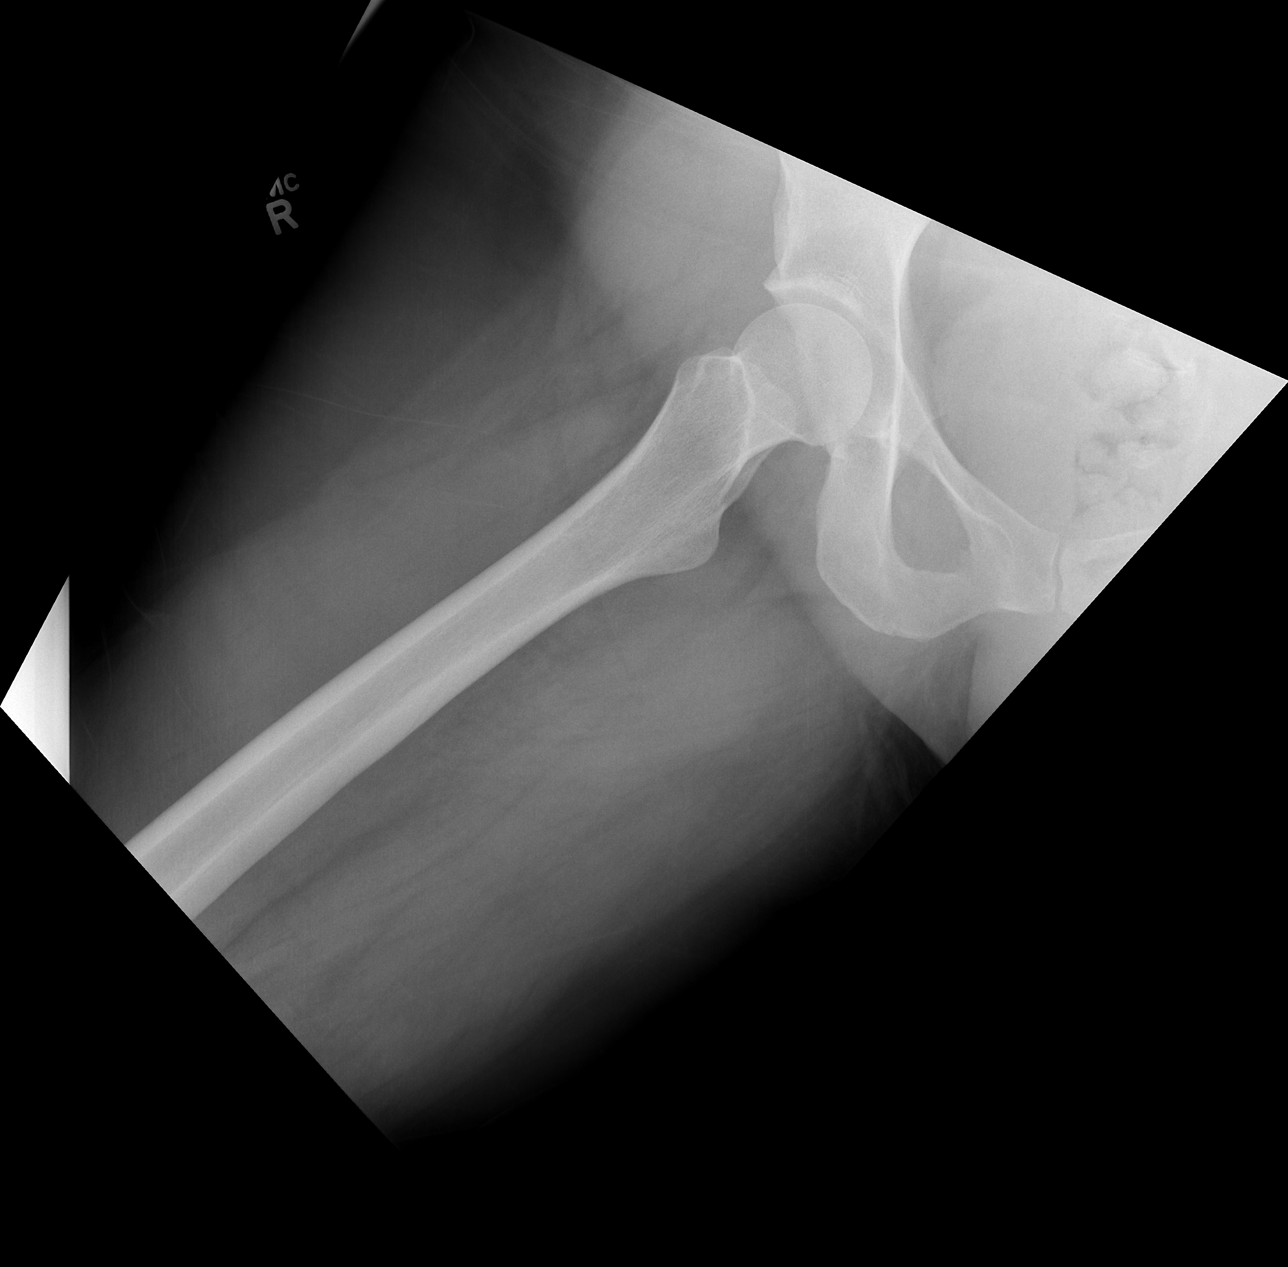

[3 of 3 positions shown; findings below may reference images not displayed]

FINDINGS: Osseous mineralization normal.

Hip and SI joint spaces symmetric and preserved.

No acute fracture, dislocation, or bone destruction.
IMPRESSION: Normal exam.

## 2016-09-28 ENCOUNTER — Encounter (HOSPITAL_COMMUNITY): Payer: Self-pay | Admitting: Emergency Medicine

## 2016-09-28 DIAGNOSIS — Y999 Unspecified external cause status: Secondary | ICD-10-CM | POA: Insufficient documentation

## 2016-09-28 DIAGNOSIS — Y939 Activity, unspecified: Secondary | ICD-10-CM | POA: Insufficient documentation

## 2016-09-28 DIAGNOSIS — S161XXA Strain of muscle, fascia and tendon at neck level, initial encounter: Secondary | ICD-10-CM | POA: Insufficient documentation

## 2016-09-28 DIAGNOSIS — Z79899 Other long term (current) drug therapy: Secondary | ICD-10-CM | POA: Insufficient documentation

## 2016-09-28 DIAGNOSIS — Y929 Unspecified place or not applicable: Secondary | ICD-10-CM | POA: Insufficient documentation
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
## 2016-09-29 ENCOUNTER — Emergency Department (HOSPITAL_COMMUNITY)
Admission: EM | Admit: 2016-09-29 | Discharge: 2016-09-29 | Disposition: A | Discharge status: Home / Self Care | Payer: Self-pay | Attending: Emergency Medicine | Admitting: Emergency Medicine

## 2016-09-29 ENCOUNTER — Emergency Department (HOSPITAL_COMMUNITY): Payer: Self-pay

## 2016-09-29 DIAGNOSIS — R52 Pain, unspecified: Secondary | ICD-10-CM

## 2016-09-29 DIAGNOSIS — S161XXA Strain of muscle, fascia and tendon at neck level, initial encounter: Secondary | ICD-10-CM

## 2016-09-29 MED ORDER — CYCLOBENZAPRINE HCL 10 MG PO TABS
10.0000 mg | ORAL_TABLET | Freq: Once | ORAL | Status: AC
  Administered 2016-09-29: 10 mg via ORAL
  Filled 2016-09-29: qty 1

## 2016-09-29 MED ORDER — CYCLOBENZAPRINE HCL 10 MG PO TABS: 10.0000 mg | ORAL_TABLET | Freq: Every evening | ORAL | 0 refills | Status: AC | PRN

## 2016-09-29 MED ORDER — IBUPROFEN 600 MG PO TABS: 600.0000 mg | ORAL_TABLET | Freq: Four times a day (QID) | ORAL | 0 refills | Status: AC | PRN

## 2016-09-29 MED ORDER — IBUPROFEN 200 MG PO TABS
600.0000 mg | ORAL_TABLET | Freq: Once | ORAL | Status: AC
  Administered 2016-09-29: 600 mg via ORAL
  Filled 2016-09-29: qty 3
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
## 2016-09-29 NOTE — Discharge Instructions (Signed)
Take anti-inflammatory (Ibuprofen) for the next week. Take this medicine with food. Take muscle relaxer at bedtime to help you sleep. This medicine makes you drowsy so do not take before driving or work Use a heating pad for sore muscles - use for 20 minutes several times a day

## 2017-05-30 ENCOUNTER — Emergency Department (HOSPITAL_COMMUNITY)
Admission: EM | Admit: 2017-05-30 | Discharge: 2017-05-30 | Disposition: A | Discharge status: Home / Self Care | Payer: Commercial Managed Care - PPO | Attending: Emergency Medicine | Admitting: Emergency Medicine

## 2017-05-30 ENCOUNTER — Encounter (HOSPITAL_COMMUNITY): Payer: Self-pay | Admitting: *Deleted

## 2017-05-30 DIAGNOSIS — J0100 Acute maxillary sinusitis, unspecified: Secondary | ICD-10-CM | POA: Diagnosis not present

## 2017-05-30 DIAGNOSIS — R509 Fever, unspecified: Secondary | ICD-10-CM | POA: Diagnosis present

## 2017-05-30 DIAGNOSIS — R590 Localized enlarged lymph nodes: Secondary | ICD-10-CM | POA: Insufficient documentation

## 2017-05-30 DIAGNOSIS — J029 Acute pharyngitis, unspecified: Secondary | ICD-10-CM | POA: Insufficient documentation

## 2017-05-30 LAB — RAPID STREP SCREEN (MED CTR MEBANE ONLY): Streptococcus, Group A Screen (Direct): NEGATIVE

## 2017-05-30 MED ORDER — FLUTICASONE PROPIONATE 50 MCG/ACT NA SUSP: 1.0000 | Freq: Every day | NASAL | 0 refills | Status: AC

## 2017-05-30 MED ORDER — AMOXICILLIN-POT CLAVULANATE 875-125 MG PO TABS: 1.0000 | ORAL_TABLET | Freq: Two times a day (BID) | ORAL | 0 refills | Status: AC

## 2017-05-30 NOTE — ED Provider Notes (Signed)
[facility]-EMERGENCY DEPT Provider Note   CSN: [id number] Arrival date & time: 05/30/17  0112     History   Chief Complaint Chief Complaint  Patient presents with  . Sore Throat    x 6 days    HPI [name] is a 31 y.o. female presenting with sore throat and fever.  She states that she has had a sore throat since Monday. She has been having high fevers, managing her symptoms with NyQuil and DayQuil without relief. Patient states symptoms are worsening, she has developed nasal congestion and a slight cough. Cough is worse at night. She reports swollen lymph nodes of the neck, that are sore. She states that she is able to tolerate oral intake, however it hurts. She reports associated frontal sinus pressure. She denies other headache, chest pain, shortness of breath, difficulty breathing, nausea, vomiting, abdominal pain, or rash. She denies sick contacts. She is not immunocompromised. She has not used any antibiotics recently. She is not allergic to any antibiotics. She does not have a primary care doctor.   HPI  Past Medical History:  Diagnosis Date  . Asthma 2011   pregnancy induced  . Depression 2011  . Hx of intrauterine growth retardation and stillbirth, currently pregnant   . Incomplete miscarriage 03/24/2011  . Normal labor 06/16/2012  . Postpartum care and examination immediately after delivery (06/16/12) 06/17/2012  . S/P bilateral tubal ligation (10/9) 06/15/2014  . UTI (lower urinary tract infection)     Patient Active Problem List   Diagnosis Date Noted  . SVD (spontaneous vaginal delivery) 06/15/2014  . S/P bilateral tubal ligation (10/9) 06/15/2014  . Pregnancy 06/14/2014  . Postpartum care following vaginal delivery (10/8) 06/14/2014  . 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 DILATION AND EVACUATION  03/24/2011   Procedure: DILATATION AND EVACUATION (D&E);  Surgeon: Janine Limbo, MD;  Location: [facility];  Service: Gynecology;  Laterality: N/A;  . NO PAST SURGERIES    . TUBAL LIGATION N/A 06/15/2014   Procedure: POST PARTUM TUBAL LIGATION;  Surgeon: Robley Fries, MD;  Location: [facility];  Service: Gynecology;  Laterality: N/A;    OB History    Gravida Para Term Preterm AB Living   0 1 4   SAB TAB Ectopic Multiple Live Births   1 0 0 0 4       Home Medications    Prior to Admission medications   Medication Sig Start Date End Date Taking? Authorizing Provider  amoxicillin-clavulanate (AUGMENTIN) 875-125 MG tablet Take 1 tablet by mouth every 12 (twelve) hours. 05/30/17 06/09/17  Camdynn Maranto, PA-C  cyclobenzaprine (FLEXERIL) 10 MG tablet Take 1 tablet (10 mg total) by mouth at bedtime and may repeat dose one time if needed. 09/29/16   Bethel Born, PA-C  fluticasone (FLONASE) 50 MCG/ACT nasal spray Place 1 spray into both nostrils daily. 05/30/17   Deyjah Kindel, PA-C  ibuprofen (ADVIL,MOTRIN) 600 MG tablet Take 1 tablet (600 mg total) by mouth every 6 (six) hours as needed. 09/29/16   Bethel Born, PA-C  Prenatal Vit-Fe Fumarate-FA (PRENATAL MULTIVITAMIN) TABS Take 1 tablet by mouth daily.    [provider]    Family History Family History  Problem Relation Age of Onset  . Dwarfism Sister   . Diabetes Maternal Grandfather   . Heart attack Paternal Grandfather     Social History Social History  Substance  Use Topics  . Smoking status: Never Smoker  . Smokeless tobacco: Not on file  . Alcohol use Yes     Comment: not since pregnancy     Allergies   Bee venom   Review of Systems Review of Systems  Constitutional: Positive for fever (subjective).  HENT: Positive for congestion, sinus pressure and sore throat. Negative for trouble swallowing and voice change.   Eyes: Negative for pain and discharge.  Respiratory: Positive for cough. Negative for chest tightness and shortness of breath.   Cardiovascular:  Negative for chest pain.  Gastrointestinal: Negative for abdominal pain, nausea and vomiting.  Skin: Negative for rash.     Physical Exam Updated Vital Signs BP 100/71   Pulse 60   Temp 97.9 F (36.6 C) (Oral)   Resp 16   Ht  (1.727 m)   Wt 74.8 kg (165 lb)   LMP 05/26/2017 (Exact Date)   SpO2 94%   BMI 25.09 kg/m   Physical Exam  Constitutional: She is oriented to person, place, and time. She appears well-developed and well-nourished. No distress.  HENT:  Head: Normocephalic and atraumatic.  Right Ear: Tympanic membrane, external ear and ear canal normal.  Left Ear: Tympanic membrane, external ear and ear canal normal.  Nose: Mucosal edema present. Right sinus exhibits maxillary sinus tenderness. Right sinus exhibits no frontal sinus tenderness. Left sinus exhibits maxillary sinus tenderness. Left sinus exhibits no frontal sinus tenderness.  Mouth/Throat: Uvula is midline, oropharynx is clear and moist and mucous membranes are normal. No tonsillar exudate.  OP without erythema or exudate. No tonsillar swelling or exudate. Uvula midline without deviation. Equal palatal rise. No trismus. No pain under the tongue. Patient handling secretions without difficulty  Eyes: Pupils are equal, round, and reactive to light. Conjunctivae and EOM are normal.  Neck: Normal range of motion. Neck supple.  Cardiovascular: Normal rate, regular rhythm and intact distal pulses.   Pulmonary/Chest: Effort normal and breath sounds normal. No respiratory distress. She has no decreased breath sounds. She has no wheezes. She has no rhonchi. She has no rales. She exhibits no tenderness.  Patient speaking in full sentences without difficulty. Good air movement in all lung fields. No adventitious sounds.  Abdominal: Soft. Bowel sounds are normal. She exhibits no distension. There is no tenderness.  Musculoskeletal: Normal range of motion.  Lymphadenopathy:    She has cervical adenopathy.  Neurological:  She is alert and oriented to person, place, and time.  Skin: Skin is warm and dry.  Psychiatric: She has a normal mood and affect.  Nursing note and vitals reviewed.    ED Treatments / Results  Labs (all labs ordered are listed, but only abnormal results are displayed) Labs Reviewed  RAPID STREP SCREEN (NOT AT [facility])  CULTURE, GROUP A STREP [facility])    EKG  EKG Interpretation None       Radiology No results found.  Procedures Procedures (including critical care time)  Medications Ordered in ED Medications - No data to display   Initial Impression / Assessment and Plan / ED Course  I have reviewed the triage vital signs and the nursing notes.  Pertinent labs & imaging results that were available during my care of the patient were reviewed by me and considered in my medical decision making (see chart for details).     Patient presenting with one-week history of sore throat and fever. States symptoms are worsening. Physical exam shows patient is currently afebrile, not tachycardic, not hypotensive. No  exudate of OP or tonsils. Tenderness to palpation of maxillary sinuses. Cervical LAD. Lung exam reassuring. Strep negative. Likely sinusitis, doubt pneumonia, tonsillar abscess, or Ludwig's. As symptoms are worsening, will give her prescription for antibiotics. Discuss importance of primary care, patient to follow-up. Patient appears safe for discharge. Return precautions given. Patient states she understands and agrees to plan.  Final Clinical Impressions(s) / ED Diagnoses   Final diagnoses:  Acute maxillary sinusitis, recurrence not specified    New Prescriptions Discharge Medication List as of 05/30/2017  8:57 AM    START taking these medications   Details  amoxicillin-clavulanate (AUGMENTIN) 875-125 MG tablet Take 1 tablet by mouth every 12 (twelve) hours., Starting Sun 05/30/2017, Until Wed 06/09/2017, Print    fluticasone (FLONASE) 50 MCG/ACT nasal spray Place 1 spray  into both nostrils daily., Starting Sun 05/30/2017, Print         Sara Selvidge, PA-C 05/30/17 1610    Benjiman Core, MD 05/30/17 1002

## 2017-05-30 NOTE — ED Triage Notes (Signed)
Pt stated "My throat started hurting last Monday.  My lymph glands are swollen."

## 2017-05-30 NOTE — ED Notes (Signed)
PT GIVEN UPDATE ABOUT WAIT 

## 2017-05-30 NOTE — Discharge Instructions (Signed)
Take antibiotics as prescribed. Complete the entire course, even if you're feeling better. Use Flonase once daily to help with nasal congestion. Use Tylenol or ibuprofen as needed for fever or pain. You may call the [phone] number in the back of the paperwork to establish primary care. Return to the emergency room if you are developing worsening symptoms after being on antibiotics for more than 48 hours. Return if you have difficulty breathing, unable to swallow and spit, or have any new, worsening, or concerning symptoms.

## 2017-05-30 NOTE — ED Notes (Signed)
Bed: WTR9 Expected date:  Expected time:  Means of arrival:  Comments: 

## 2017-06-01 LAB — CULTURE, GROUP A STREP (THRC)
